# Patient Record
Sex: Female | Born: 1970 | Race: Black or African American | Hispanic: No | Marital: Single | Smoking: Never smoker
Health system: Southern US, Community
[De-identification: ages and names within clinical notes are randomized; demographics above are authoritative.]

## PROBLEM LIST (undated history)

## (undated) DIAGNOSIS — Z8489 Family history of other specified conditions: Secondary | ICD-10-CM

## (undated) DIAGNOSIS — I10 Essential (primary) hypertension: Secondary | ICD-10-CM

## (undated) DIAGNOSIS — Z9889 Other specified postprocedural states: Secondary | ICD-10-CM

## (undated) DIAGNOSIS — R112 Nausea with vomiting, unspecified: Secondary | ICD-10-CM

## (undated) DIAGNOSIS — R519 Headache, unspecified: Secondary | ICD-10-CM

## (undated) DIAGNOSIS — T4145XA Adverse effect of unspecified anesthetic, initial encounter: Secondary | ICD-10-CM

## (undated) DIAGNOSIS — C801 Malignant (primary) neoplasm, unspecified: Secondary | ICD-10-CM

## (undated) DIAGNOSIS — K760 Fatty (change of) liver, not elsewhere classified: Secondary | ICD-10-CM

## (undated) DIAGNOSIS — D649 Anemia, unspecified: Secondary | ICD-10-CM

## (undated) DIAGNOSIS — F419 Anxiety disorder, unspecified: Secondary | ICD-10-CM

## (undated) DIAGNOSIS — T8859XA Other complications of anesthesia, initial encounter: Secondary | ICD-10-CM

## (undated) DIAGNOSIS — Z1379 Encounter for other screening for genetic and chromosomal anomalies: Secondary | ICD-10-CM

## (undated) DIAGNOSIS — J189 Pneumonia, unspecified organism: Secondary | ICD-10-CM

## (undated) HISTORY — PX: ABDOMINAL HYSTERECTOMY: SHX81

## (undated) HISTORY — PX: BREAST SURGERY: SHX581

## (undated) HISTORY — PX: BACK SURGERY: SHX140

## (undated) HISTORY — PX: DILATION AND CURETTAGE OF UTERUS: SHX78

## (undated) HISTORY — PX: TONSILLECTOMY AND ADENOIDECTOMY: SUR1326

## (undated) HISTORY — DX: Encounter for other screening for genetic and chromosomal anomalies: Z13.79

## (undated) HISTORY — DX: Essential (primary) hypertension: I10

---
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
## 2006-08-08 DIAGNOSIS — C801 Malignant (primary) neoplasm, unspecified: Secondary | ICD-10-CM

## 2006-08-08 HISTORY — PX: MASTECTOMY: SHX3

## 2006-08-08 HISTORY — DX: Malignant (primary) neoplasm, unspecified: C80.1

## 2007-08-09 HISTORY — PX: PLACEMENT OF BREAST IMPLANTS: SHX6334

## 2016-03-15 ENCOUNTER — Encounter: Payer: Self-pay | Admitting: Emergency Medicine

## 2016-03-15 ENCOUNTER — Emergency Department
Admission: EM | Admit: 2016-03-15 | Discharge: 2016-03-16 | Disposition: A | Discharge status: Home / Self Care | Payer: Managed Care, Other (non HMO) | Attending: Emergency Medicine | Admitting: Emergency Medicine

## 2016-03-15 DIAGNOSIS — R112 Nausea with vomiting, unspecified: Secondary | ICD-10-CM | POA: Insufficient documentation

## 2016-03-15 DIAGNOSIS — Z853 Personal history of malignant neoplasm of breast: Secondary | ICD-10-CM | POA: Diagnosis not present

## 2016-03-15 HISTORY — DX: Malignant (primary) neoplasm, unspecified: C80.1

## 2016-03-15 LAB — COMPREHENSIVE METABOLIC PANEL
ALT: 8 U/L — AB (ref 14–54)
AST: 17 U/L (ref 15–41)
Albumin: 4 g/dL (ref 3.5–5.0)
Alkaline Phosphatase: 64 U/L (ref 38–126)
Anion gap: 8 (ref 5–15)
BUN: 8 mg/dL (ref 6–20)
CHLORIDE: 103 mmol/L (ref 101–111)
CO2: 26 mmol/L (ref 22–32)
CREATININE: 0.65 mg/dL (ref 0.44–1.00)
Calcium: 9.3 mg/dL (ref 8.9–10.3)
GFR calc non Af Amer: >60 mL/min (ref >60)
Glucose, Bld: 97 mg/dL (ref 65–99)
POTASSIUM: 3.3 mmol/L — AB (ref 3.5–5.1)
SODIUM: 137 mmol/L (ref 135–145)
Total Bilirubin: 0.6 mg/dL (ref 0.3–1.2)
Total Protein: 7.4 g/dL (ref 6.5–8.1)

## 2016-03-15 LAB — URINALYSIS COMPLETE WITH MICROSCOPIC (ARMC ONLY)
BILIRUBIN URINE: NEGATIVE
Glucose, UA: NEGATIVE mg/dL
Ketones, ur: NEGATIVE mg/dL
Leukocytes, UA: NEGATIVE
Nitrite: NEGATIVE
PH: 6 (ref 5.0–8.0)
PROTEIN: NEGATIVE mg/dL
Specific Gravity, Urine: 1.018 (ref 1.005–1.030)

## 2016-03-15 LAB — CBC
HEMATOCRIT: 38 % (ref 35.0–47.0)
Hemoglobin: 12.4 g/dL (ref 12.0–16.0)
MCH: 25.2 pg — AB (ref 26.0–34.0)
MCHC: 32.7 g/dL (ref 32.0–36.0)
MCV: 77 fL — AB (ref 80.0–100.0)
PLATELETS: 275 10*3/uL (ref 150–440)
RBC: 4.93 MIL/uL (ref 3.80–5.20)
RDW: 14.7 % — ABNORMAL HIGH (ref 11.5–14.5)
WBC: 14.1 10*3/uL — ABNORMAL HIGH (ref 3.6–11.0)

## 2016-03-15 LAB — POCT PREGNANCY, URINE: PREG TEST UR: NEGATIVE

## 2016-03-15 LAB — LIPASE, BLOOD: LIPASE: 21 U/L (ref 11–51)

## 2016-03-15 MED ORDER — ONDANSETRON 4 MG PO TBDP
4.0000 mg | ORAL_TABLET | Freq: Once | ORAL | Status: DC
  Filled 2016-03-15: qty 1

## 2016-03-15 NOTE — ED Provider Notes (Signed)
[facility] Emergency Department Provider Note  ____________________________________________   None    (approximate)  I have reviewed the triage vital signs and the nursing notes.   HISTORY  Chief Complaint Emesis and Nausea    HPI Robin Wilkins is a 45 y.o. female with a history of chronic low back pain who had a lumbar spinal injection by an orthopedic surgeon in [place] 1 week ago.  She presents for evaluation of multiple episodes of nausea and vomiting that has been occurring since she started taking methocarbamol as prescribed by her orthopedic surgeon.  She denies fever/chills, chest pain, shortness of breath, abdominal pain, dysuria.  She states that her back continues to hurt even worse than it did before the injection but that she is used to this pain.  She describes the little episodes of vomiting as severe and does not seem to be related to anything in particular other eating did seem to make it worse.  She went to an urgent care who then recommended she come to the emergency department.  She is currently feeling better and has not vomited recently.   Past Medical History:  Diagnosis Date  . Cancer ([place])    left breast    There are no active problems to display for this patient.   History reviewed. No pertinent surgical history.  Prior to Admission medications   Medication Sig Start Date End Date Taking? Authorizing Provider  ondansetron (ZOFRAN ODT) 4 MG disintegrating tablet Allow 1-2 tablets to dissolve in your mouth every 8 hours as needed for nausea/vomiting 03/16/16   Hinda Kehr, MD    Allergies Review of patient's allergies indicates no known allergies.  No family history on file.  Social History Social History  Substance Use Topics  . Smoking status: Never Smoker  . Smokeless tobacco: Never Used  . Alcohol use No    Review of Systems Constitutional: No fever/chills Eyes: No visual changes. ENT: No sore  throat. Cardiovascular: Denies chest pain. Respiratory: Denies shortness of breath. Gastrointestinal: No abdominal pain.  Multiple episodes of vomiting and persistent nausea.  No diarrhea.  No constipation. Genitourinary: Negative for dysuria. Musculoskeletal: Negative for back pain. Skin: Negative for rash. Neurological: Negative for headaches, focal weakness or numbness.  10-point ROS otherwise negative.  ____________________________________________   PHYSICAL EXAM:  VITAL SIGNS: ED Triage Vitals  Enc Vitals Group     BP 03/15/16 1923 135/75     Pulse Rate 03/15/16 1923 97     Resp 03/15/16 1923 18     Temp 03/15/16 1923 99 F (37.2 C)     Temp Source 03/15/16 1923 Oral     SpO2 03/15/16 1923 100 %     Weight 03/15/16 1923 236 lb (107 kg)     Height 03/15/16 1923 5\' 8"  (1.727 m)     Head Circumference --      Peak Flow --      Pain Score 03/15/16 1924 7     Pain Loc --      Pain Edu? --      Excl. in [place]? --     Constitutional: Alert and oriented. Well appearing and in no acute distress. Eyes: Conjunctivae are normal. PERRL. EOMI. Head: Atraumatic. Nose: No congestion/rhinnorhea. Mouth/Throat: Mucous membranes are moist.  Oropharynx non-erythematous. Neck: No stridor.  No meningeal signs.   Cardiovascular: Normal rate, regular rhythm. Good peripheral circulation. Grossly normal heart sounds.   Respiratory: Normal respiratory effort.  No retractions. Lungs CTAB. Gastrointestinal: Soft  and nontender. No distention.  Musculoskeletal: No lower extremity tenderness nor edema. No gross deformities of extremities. Mild tenderness to palpation all throughout the lumbar spine but with no evidence of cellulitis, induration, fluctuance, or other indication of infection or gross deformity. Neurologic:  Normal speech and language. No gross focal neurologic deficits are appreciated.  Skin:  Skin is warm, dry and intact. No rash noted. Psychiatric: Mood and affect are normal. Speech  and behavior are normal.  ____________________________________________   LABS (all labs ordered are listed, but only abnormal results are displayed)  Labs Reviewed  COMPREHENSIVE METABOLIC PANEL - Abnormal; Notable for the following:       Result Value   Potassium 3.3 (*)    ALT 8 (*)    All other components within normal limits  CBC - Abnormal; Notable for the following:    WBC 14.1 (*)    MCV 77.0 (*)    MCH 25.2 (*)    RDW 14.7 (*)    All other components within normal limits  URINALYSIS COMPLETEWITH MICROSCOPIC ([facility] ONLY) - Abnormal; Notable for the following:    Color, Urine YELLOW (*)    APPearance HAZY (*)    Hgb urine dipstick 2+ (*)    Bacteria, UA RARE (*)    Squamous Epithelial / LPF 6-30 (*)    All other components within normal limits  LIPASE, BLOOD  POC URINE PREG, ED  POCT PREGNANCY, URINE   ____________________________________________  EKG  None ____________________________________________  RADIOLOGY   No results found.  ____________________________________________   PROCEDURES  Procedure(s) performed:   Procedures   Critical Care performed: No ____________________________________________   INITIAL IMPRESSION / ASSESSMENT AND PLAN / ED COURSE  Pertinent labs & imaging results that were available during my care of the patient were reviewed by me and considered in my medical decision making (see chart for details).  Patient feels much better after Zofran and IV fluids.  She tolerated a by mouth challenge without difficulty.  I will prescribe her Zofran and recommend that she not take the methocarbamol anymore as she may be having a reaction to that medication.  She will follow up with her orthopedic surgeon for additional recommendations and management of her chronic back pain.  ____________________________________________  FINAL CLINICAL IMPRESSION(S) / ED DIAGNOSES  Final diagnoses:  Non-intractable vomiting with nausea, vomiting  of unspecified type     MEDICATIONS GIVEN DURING THIS VISIT:  Medications  ondansetron (ZOFRAN-ODT) disintegrating tablet 4 mg (not administered)     NEW OUTPATIENT MEDICATIONS STARTED DURING THIS VISIT:  New Prescriptions   ONDANSETRON (ZOFRAN ODT) 4 MG DISINTEGRATING TABLET    Allow 1-2 tablets to dissolve in your mouth every 8 hours as needed for nausea/vomiting      Note:  This document was prepared using Dragon voice recognition software and may include unintentional dictation errors.    Hinda Kehr, MD 03/16/16 [phone]

## 2016-03-15 NOTE — ED Notes (Signed)
Pt. Reports an epidural tx to back 3 weeks ago to address a ruptured disc. Returned to work this week on "light duty", but reports her job did not follow orders and pt. Was crying in pain  At work 3 times yesterday. Pt. Reports being nauseated and vomiting everything she takes in PO since. Pt. Said unsure if something wrong abdominally or if nausea and vomiting is due to the pain.

## 2016-03-15 NOTE — ED Triage Notes (Signed)
Has a back injury from work, back was injected last week.  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  Vomited last night.  Continues to have nausea and vomiting today.

## 2016-03-16 MED ORDER — ONDANSETRON 4 MG PO TBDP: ORAL_TABLET | ORAL | 0 refills | Status: AC

## 2016-03-16 NOTE — ED Notes (Signed)
MD updated on pt. Successful PO challenge and is preparing to send patient home.

## 2016-03-16 NOTE — Discharge Instructions (Signed)
As we discussed, your workup today was reassuring.  Though we do not know exactly what is causing your symptoms, it appears that you have no emergent medical condition at this time are safe to go home and follow up as recommended in this paperwork.  As you are most likely having a reaction to the new medication you are taking (methocarbamol), we recommend you avoid this one if possible, at least until you can follow-up with your orthopedic doctor.  Please return immediately to the Emergency Department if you develop any new or worsening symptoms that concern you.

## 2016-03-16 NOTE — ED Notes (Signed)
Pt. Verbalizes understanding of d/c instructions, prescriptions, and follow-up. VS stable. Pt. In NAD at time of d/c and denies further concerns regarding this visit. Pt.wheeled Out of the unit by RN

## 2017-04-04 ENCOUNTER — Inpatient Hospital Stay: Payer: Managed Care, Other (non HMO) | Attending: Oncology | Admitting: Oncology

## 2017-04-04 ENCOUNTER — Inpatient Hospital Stay: Payer: Managed Care, Other (non HMO)

## 2017-04-04 ENCOUNTER — Encounter: Payer: Self-pay | Admitting: Oncology

## 2017-04-04 VITALS — BP 137/80 | HR 80 | Temp 98.0°F | Resp 18 | Ht 67.5 in | Wt 251.6 lb

## 2017-04-04 DIAGNOSIS — Z170 Estrogen receptor positive status [ER+]: Secondary | ICD-10-CM | POA: Insufficient documentation

## 2017-04-04 DIAGNOSIS — Z9221 Personal history of antineoplastic chemotherapy: Secondary | ICD-10-CM | POA: Insufficient documentation

## 2017-04-04 DIAGNOSIS — R222 Localized swelling, mass and lump, trunk: Secondary | ICD-10-CM | POA: Insufficient documentation

## 2017-04-04 DIAGNOSIS — Z79899 Other long term (current) drug therapy: Secondary | ICD-10-CM | POA: Insufficient documentation

## 2017-04-04 DIAGNOSIS — Z853 Personal history of malignant neoplasm of breast: Secondary | ICD-10-CM

## 2017-04-04 DIAGNOSIS — Z923 Personal history of irradiation: Secondary | ICD-10-CM | POA: Diagnosis not present

## 2017-04-04 DIAGNOSIS — Z79810 Long term (current) use of selective estrogen receptor modulators (SERMs): Secondary | ICD-10-CM | POA: Insufficient documentation

## 2017-04-04 DIAGNOSIS — Z803 Family history of malignant neoplasm of breast: Secondary | ICD-10-CM | POA: Insufficient documentation

## 2017-04-04 DIAGNOSIS — Z9013 Acquired absence of bilateral breasts and nipples: Secondary | ICD-10-CM

## 2017-04-04 DIAGNOSIS — C50911 Malignant neoplasm of unspecified site of right female breast: Secondary | ICD-10-CM | POA: Insufficient documentation

## 2017-04-04 DIAGNOSIS — I10 Essential (primary) hypertension: Secondary | ICD-10-CM | POA: Diagnosis not present

## 2017-04-04 DIAGNOSIS — E559 Vitamin D deficiency, unspecified: Secondary | ICD-10-CM | POA: Diagnosis not present

## 2017-04-04 DIAGNOSIS — Z90710 Acquired absence of both cervix and uterus: Secondary | ICD-10-CM | POA: Insufficient documentation

## 2017-04-04 LAB — COMPREHENSIVE METABOLIC PANEL
ALK PHOS: 67 U/L (ref 38–126)
ALT: 19 U/L (ref 14–54)
AST: 23 U/L (ref 15–41)
Albumin: 3.9 g/dL (ref 3.5–5.0)
Anion gap: 7 (ref 5–15)
BUN: 8 mg/dL (ref 6–20)
CALCIUM: 9.1 mg/dL (ref 8.9–10.3)
CO2: 29 mmol/L (ref 22–32)
CREATININE: 0.69 mg/dL (ref 0.44–1.00)
Chloride: 102 mmol/L (ref 101–111)
GFR calc non Af Amer: >60 mL/min (ref >60)
GLUCOSE: 90 mg/dL (ref 65–99)
Potassium: 3.4 mmol/L — ABNORMAL LOW (ref 3.5–5.1)
SODIUM: 138 mmol/L (ref 135–145)
TOTAL PROTEIN: 7 g/dL (ref 6.5–8.1)
Total Bilirubin: 0.5 mg/dL (ref 0.3–1.2)

## 2017-04-04 LAB — CBC WITH DIFFERENTIAL/PLATELET
BASOS PCT: 1 %
Basophils Absolute: 0 10*3/uL (ref 0–0.1)
EOS ABS: 0 10*3/uL (ref 0–0.7)
Eosinophils Relative: 1 %
HCT: 35.5 % (ref 35.0–47.0)
Hemoglobin: 11.8 g/dL — ABNORMAL LOW (ref 12.0–16.0)
Lymphocytes Relative: 32 %
Lymphs Abs: 2.7 10*3/uL (ref 1.0–3.6)
MCH: 26.3 pg (ref 26.0–34.0)
MCHC: 33.3 g/dL (ref 32.0–36.0)
MCV: 79 fL — ABNORMAL LOW (ref 80.0–100.0)
MONO ABS: 0.7 10*3/uL (ref 0.2–0.9)
Monocytes Relative: 8 %
NEUTROS ABS: 5 10*3/uL (ref 1.4–6.5)
Neutrophils Relative %: 58 %
Platelets: 277 10*3/uL (ref 150–440)
RBC: 4.49 MIL/uL (ref 3.80–5.20)
RDW: 13.4 % (ref 11.5–14.5)
WBC: 8.4 10*3/uL (ref 3.6–11.0)

## 2017-04-04 LAB — IRON AND TIBC
IRON: 97 ug/dL (ref 28–170)
Saturation Ratios: 33 % — ABNORMAL HIGH (ref 10.4–31.8)
TIBC: 293 ug/dL (ref 250–450)
UIBC: 196 ug/dL

## 2017-04-04 LAB — FERRITIN: Ferritin: 215 ng/mL (ref 11–307)

## 2017-04-04 MED ORDER — TAMOXIFEN CITRATE 10 MG PO TABS: 20.0000 mg | ORAL_TABLET | Freq: Every day | ORAL | 3 refills | Status: DC

## 2017-04-04 NOTE — Progress Notes (Signed)
Patient here today as a new patient.  Patient

## 2017-04-04 NOTE — Progress Notes (Addendum)
Hematology/Oncology Consult note [facility] [phone] [phone]  CONSULT NOTE Patient Care Team: Robin Crouch, MD as PCP - General (Internal Medicine)  CHIEF COMPLAINTS/PURPOSE OF CONSULTATION:  I have a history of breast cancer   HISTORY OF PRESENTING ILLNESS:  1 Robin Wilkins 46 y.o.  female with past medical history listed as below, most significant for a history of CANCER who was referred to Dr. Doy Wilkins to me for evaluation and management of patient's breast cancer treatment. Patient reports that she moved from [place]. In 2008 may she was diagnosed with left breast cancer. She remembered that she had an excisional biopsy of the breast lesion at first. She was told that it was hormone receptor positive and HER-2 positive. She underwent neoadjuvant chemotherapy July to October 2008. She had bilateral mastectomy on 05/09/2007. She also underwent radiation adjuvantly. She completed 1 year of Herceptin. She was started on tamoxifen in 2009 after she completed all her chemotherapy and radiation. Patient reports that the initial diagnosis, chemotherapy, and surgery were done at Robin Wilkins. Later she transferred her care to Robin Wilkins and initially she followed up with Dr. Brigitte Wilkins once Dr. Manuella Wilkins retired she follows up with Dr. Corrie Wilkins. She has done genetic test, which was negative per patient.  Patient is single, she has 2 adult children, one boy and one girl. She has a history of hysterectomy.  2  04/06/2017 We've received incomplete medical records from Robin Wilkins. In 2008, he initially presented with bilateral axillary lymph node adenopathy which lead to further workup. We do not have her mammogram and CT report. 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 ER 11% PR 1%, HER-2 overexpression.  Patient underwent PET scan on 02/01/2007 which showed solid hypermetabolic soft tissue mass in the left axilla consistent with metastatic adenopathy measuring 4.9 cm x 2.6 cm no other hypermetabolic foci demonstrated. The patient was started on neoadjuvant chemotherapy with AC/ T regimen. We do not have details about her chemotherapy regimen. Per patient she also completed 1 year of Herceptin. Per Dr. Patrice Wilkins, Robin Wilkins's note, the patient had great response to the new adjuvant chemotherapy. It was noted that her MRI which was done in 03/14/2007 showed no residual breast or axillary masses. We do not have the full MRI report.  Patient underwent bilateral mastectomy on 06/05/2007. Right mastectomy with attached axillary tail showing focal increased of fibrous stroma, cystic dilatation of the ducts with columnar cell changes, foci of microcalcifications, mild to moderate ductal hyperplasia and the scattered foci of atypical lobular hyperplastic. No in situ carcinoma or invasive carcinoma identified. Skin and nipple of the right breast showing no diagnostic abnormality. Left mastectomy with axillary tail showing organizing fat necrosis, and fibrosis around the previous surgical biopsy site 8 to 9:00 position, retroareolar, with foci of granulomatous changes with presence of many multinucleated giant cells. No residual carcinoma identified. Breast tissue away from the biopsy site showing focal stromal fibrosis, cystic dilatation of ducts with columnar cell changes, mild to moderate ductal hyperplastic, foci of sclerosing adenosis and atypical lobular hyperplastic. 3 level I and the level II left axillary lymph nodes showing metastatic and a moderately differentiated ductal carcinoma involving 4 of the 8 lymph nodes, Matted, group of lymph nodes measures 3.5 cm in greatest dimension. Focal fibrosis and the calcification  noted probably related  to the preoperative chemotherapy. Final TNM coating T2, N2 A, NX.  Patient was started on tamoxifen 20 mg in 2009. Patient also had bilateral breast reconstruction. In Dr. Manson Wilkins note, she had an episode of clotting which threatened to be destroying the entire left breast reconstruction surgery she was treated with Lovenox, and then have evidence of worsening thrombosis and the vascularization, and he walks she was put on aspirin as well and then finally tamoxifen has been temporarily discontinued, which was later restarted. .   ROS:   Review of Systems  Constitutional: Negative.   HENT:  Negative.   Eyes: Negative.   Respiratory: Negative.   Cardiovascular: Negative.   Gastrointestinal: Negative.   Endocrine: Negative.   Genitourinary: Negative.    Musculoskeletal: Negative.   Skin: Negative.   Neurological: Negative.   Hematological: Negative.   Psychiatric/Behavioral: Negative.     MEDICAL HISTORY:  Past Medical History:  Diagnosis Date  . Cancer [facility])    left breast  . Hypertension     SURGICAL HISTORY: Past Surgical History:  Procedure Laterality Date  . ABDOMINAL HYSTERECTOMY    . BACK SURGERY    . MASTECTOMY Bilateral   . TONSILLECTOMY AND ADENOIDECTOMY      SOCIAL HISTORY: Social History   Social History  . Marital status: Single    Spouse name: N/A  . Number of children: N/A  . Years of education: N/A   Occupational History  . Not on file.   Social History Main Topics  . Smoking status: Never Smoker  . Smokeless tobacco: Never Used  . Alcohol use No  . Drug use: No  . Sexual activity: Not Currently   Other Topics Concern  . Not on file   Social History Narrative  . No narrative on file    FAMILY HISTORY: Family History  Problem Relation Age of Onset  . Kidney cancer Mother   . Diabetes Mother   . Hypertension Mother   . Breast cancer Paternal Aunt   . Breast cancer Paternal Grandmother     ALLERGIES:  has No Known  Allergies.  MEDICATIONS:  Current Outpatient Prescriptions  Medication Sig Dispense Refill  . cyclobenzaprine (FLEXERIL) 10 MG tablet Take 10 mg by mouth as needed.    Marland Kitchen losartan-hydrochlorothiazide (HYZAAR) 100-12.5 MG tablet Take by mouth.    . naproxen (EC NAPROSYN) 500 MG EC tablet Take 500 mg by mouth 2 (two) times daily.    . ondansetron (ZOFRAN ODT) 4 MG disintegrating tablet Allow 1-2 tablets to dissolve in your mouth every 8 hours as needed for nausea/vomiting 30 tablet 0  . tamoxifen (NOLVADEX) 10 MG tablet Take 10 mg by mouth 2 (two) times daily.    Marland Kitchen triamcinolone (KENALOG) 0.025 % cream APPLY TO AFFECTED AREA TWICE A DAY    . venlafaxine XR (EFFEXOR-XR) 150 MG 24 hr capsule Take 150 mg by mouth daily.     No current facility-administered medications for this visit.       Marland Kitchen  PHYSICAL EXAMINATION: ECOG PERFORMANCE STATUS: 0 - Asymptomatic Vitals:   04/04/17 0930  BP: 137/80  Wilkins: 80  Resp: 18  Temp: 98 F (36.7 C)   Filed Weights   04/04/17 0930  Weight: 251 lb 9 oz (114.1 kg)    GENERAL: Well-nourished well-developed; Alert, no distress and comfortable.  EYES: no pallor or icterus OROPHARYNX: no thrush or ulceration; good dentition  NECK: supple, no masses felt LYMPH:  no palpable lymphadenopathy in the  cervical, axillary or inguinal regions LUNGS: clear to auscultation and  No wheeze or crackles HEART/CVS: regular rate & rhythm and no murmurs; No lower extremity edema ABDOMEN: abdomen soft, non-tender and normal bowel sounds Musculoskeletal:no cyanosis of digits and no clubbing  PSYCH: alert & oriented x 3  NEURO: no focal motor/sensory deficits SKIN:  no rashes or significant lesions Breast exam is performed in seated and lying down position. Patient is status post bilateral mastectomy with reconstruction. The implant edges are intact. Small mobile nodule palpated 2'o clock.   No evidence of bilateral axillary adenopathy.   LABORATORY DATA:  I have  reviewed the data as listed Lab Results  Component Value Date   WBC 8.4 04/04/2017   HGB 11.8 (L) 04/04/2017   HCT 35.5 04/04/2017   MCV 79.0 (L) 04/04/2017   PLT 277 04/04/2017    Recent Labs  04/04/17 1021  NA 138  K 3.4*  CL 102  CO2 29  GLUCOSE 90  BUN 8  CREATININE 0.69  CALCIUM 9.1  GFRNONAA >60  GFRAA >60  PROT 7.0  ALBUMIN 3.9  AST 23  ALT 19  ALKPHOS 67  BILITOT 0.5   Vitamin D 17.4  RADIOGRAPHIC STUDIES: I have personally reviewed the radiological images as listed and agreed with the findings in the report. No results found.  ASSESSMENT & PLAN:  1. History of breast cancer   2. Vitamin D deficiency   3. Long-term current use of tamoxifen   4. Subcutaneous nodule of chest wall    # Discussed with patient that we will need to obtain medical records from both [place] and [facility] in [place]. Once we have the records out see her again and finalize management plan. For now continue tamoxifen. In general recommends tamoxifen for 10 years. # Check CBC and CMP today. She has mild microcytosis. We'll check iron TIBC and ferritin.  #Her vitamin D level was checked with Dr. Doy Wilkins. Discussed with patient that an adequate vitamin D level is associated with lower breast cancer recurrence and better outcome. She is already taking oral vitamin D supplementation.  #Chest wall nodule, unknown etiology. Per patient has been there chronically. We will review medical records if we obtain any from the previous cancer Center. Will refer to surgeon for further workup at next follow-up.  All questions were answered. The patient knows to call the clinic with any problems questions or concerns.  Return of visit:  Thank you for this kind referral and the opportunity to participate in the care of this patient. A copy of today's note is routed to referring provider Dr.Sparks.    [name], MD, PhD Hematology Oncology [facility] Pager- [phone] 04/04/2017

## 2017-04-05 ENCOUNTER — Other Ambulatory Visit: Payer: Self-pay | Admitting: Internal Medicine

## 2017-04-05 DIAGNOSIS — R1011 Right upper quadrant pain: Secondary | ICD-10-CM

## 2017-04-12 ENCOUNTER — Ambulatory Visit
Admission: RE | Admit: 2017-04-12 | Discharge: 2017-04-12 | Disposition: A | Discharge status: Home / Self Care | Payer: Managed Care, Other (non HMO) | Source: Ambulatory Visit | Attending: Internal Medicine | Admitting: Internal Medicine

## 2017-04-12 DIAGNOSIS — K760 Fatty (change of) liver, not elsewhere classified: Secondary | ICD-10-CM | POA: Diagnosis not present

## 2017-04-12 DIAGNOSIS — R1011 Right upper quadrant pain: Secondary | ICD-10-CM | POA: Insufficient documentation

## 2017-04-18 ENCOUNTER — Inpatient Hospital Stay: Payer: Managed Care, Other (non HMO) | Attending: Oncology | Admitting: Oncology

## 2017-04-18 ENCOUNTER — Encounter: Payer: Self-pay | Admitting: Oncology

## 2017-04-18 ENCOUNTER — Encounter: Payer: Self-pay | Admitting: *Deleted

## 2017-04-18 VITALS — BP 122/79 | HR 93 | Temp 99.2°F | Wt 249.5 lb

## 2017-04-18 DIAGNOSIS — I10 Essential (primary) hypertension: Secondary | ICD-10-CM | POA: Insufficient documentation

## 2017-04-18 DIAGNOSIS — Z923 Personal history of irradiation: Secondary | ICD-10-CM | POA: Diagnosis not present

## 2017-04-18 DIAGNOSIS — Z8051 Family history of malignant neoplasm of kidney: Secondary | ICD-10-CM | POA: Diagnosis not present

## 2017-04-18 DIAGNOSIS — Z79810 Long term (current) use of selective estrogen receptor modulators (SERMs): Secondary | ICD-10-CM

## 2017-04-18 DIAGNOSIS — D649 Anemia, unspecified: Secondary | ICD-10-CM | POA: Diagnosis not present

## 2017-04-18 DIAGNOSIS — Z803 Family history of malignant neoplasm of breast: Secondary | ICD-10-CM | POA: Insufficient documentation

## 2017-04-18 DIAGNOSIS — R222 Localized swelling, mass and lump, trunk: Secondary | ICD-10-CM

## 2017-04-18 DIAGNOSIS — Z9013 Acquired absence of bilateral breasts and nipples: Secondary | ICD-10-CM

## 2017-04-18 DIAGNOSIS — Z853 Personal history of malignant neoplasm of breast: Secondary | ICD-10-CM | POA: Diagnosis present

## 2017-04-18 DIAGNOSIS — R918 Other nonspecific abnormal finding of lung field: Secondary | ICD-10-CM | POA: Insufficient documentation

## 2017-04-18 DIAGNOSIS — Z9221 Personal history of antineoplastic chemotherapy: Secondary | ICD-10-CM | POA: Diagnosis not present

## 2017-04-18 DIAGNOSIS — Z90710 Acquired absence of both cervix and uterus: Secondary | ICD-10-CM

## 2017-04-18 DIAGNOSIS — Z79899 Other long term (current) drug therapy: Secondary | ICD-10-CM | POA: Insufficient documentation

## 2017-04-18 MED ORDER — VITAMIN D 1000 UNITS PO TABS: 1000.0000 [IU] | ORAL_TABLET | Freq: Every day | ORAL | 3 refills | Status: DC

## 2017-04-18 NOTE — Progress Notes (Signed)
Hematology/Oncology Follow Up Note [facility] Telephone:([phone] [phone]  CONSULT NOTE Patient Care Team: [name], MD as PCP - General (Internal Medicine)  CHIEF COMPLAINTS/PURPOSE OF CONSULTATION:  I have a history of breast cancer   HISTORY OF PRESENTING ILLNESS:  1 Robin Wilkins 46 y.o.  female with past medical history listed as below, most significant for a history of CANCER who was referred to Dr. [name] to me for evaluation and management of patient's breast cancer treatment. Patient reports that she moved from [place]. In 2008 may she was diagnosed with left breast cancer. She remembered that she had an excisional biopsy of the breast lesion at first. She was told that it was hormone receptor positive and HER-2 positive. She underwent neoadjuvant chemotherapy July to October 2008. She had bilateral mastectomy on 05/09/2007. She also underwent radiation adjuvantly. She completed 1 year of Herceptin. She was started on tamoxifen in 2009 after she completed all her chemotherapy and radiation. Patient reports that the initial diagnosis, chemotherapy, and surgery were done at [place]. Later she transferred her care to [facility] and initially she followed up with Dr. [name] once Dr. [name] retired she follows up with Dr. [name]. She has done genetic test, which was negative per patient.  Patient is single, she has 2 adult children, one boy and one girl. She has a history of hysterectomy.  2  04/06/2017 We've received incomplete medical records from [place]. In 2008, he initially presented with bilateral axillary lymph node adenopathy which lead to further workup. We do not have her mammogram and CT report. 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 ER 11% PR 1%, HER-2 overexpression.  Patient underwent PET scan on 02/01/2007 which showed solid hypermetabolic soft tissue mass in the left axilla consistent with metastatic adenopathy measuring 4.9 cm x 2.6 cm no other hypermetabolic foci demonstrated. The patient was started on neoadjuvant chemotherapy with AC/ T regimen. We do not have details about her chemotherapy regimen. Per patient she also completed 1 year of Herceptin. Per Dr. [name], [name]'s note, the patient had great response to the new adjuvant chemotherapy. It was noted that her MRI which was done in 03/14/2007 showed no residual breast or axillary masses. We do not have the full MRI report.  Patient underwent bilateral mastectomy on 06/05/2007.  1 Right mastectomy with attached axillary tail showing focal increased of fibrous stroma, cystic dilatation of the ducts with columnar cell changes, foci of microcalcifications, mild to moderate ductal hyperplasia and the scattered foci of atypical lobular hyperplastic. No in situ carcinoma or invasive carcinoma identified. Skin and nipple of the right breast showing no diagnostic abnormality.  2 Left mastectomy with axillary tail showing organizing fat necrosis, and fibrosis around the previous surgical biopsy site 8 to 9:00 position, retroareolar, with foci of granulomatous changes with presence of many multinucleated giant cells. No residual carcinoma identified. Breast tissue away from the biopsy site showing focal stromal fibrosis, cystic dilatation of ducts with columnar cell changes, mild to moderate ductal hyperplastic, foci of sclerosing adenosis and atypical lobular hyperplastic.   3 level I and the level II left axillary lymph nodes showing metastatic and a moderately differentiated ductal carcinoma involving 4 of the 8 lymph nodes, Matted, group of lymph nodes measures 3.5 cm in  greatest dimension. Focal fibrosis and the calcification noted  probably related to the preoperative chemotherapy. Final TNM coating T2, N2a, NX.  (the original tumor was excisional biopsied).   Patient was started on tamoxifen 20 mg in 2009. Patient also had bilateral breast reconstruction. In Dr. [name] note, she had an episode of clotting which threatened to be destroying the entire left breast reconstruction surgery she was treated with Lovenox, and then have evidence of worsening thrombosis and the vascularization, and he walks she was put on aspirin as well and then finally tamoxifen has been temporarily discontinued, which was later restarted. .  INTERVAL HISTORY Patient presents to further discuss about the management plan after records are obtained.    ROS:   Review of Systems  Constitutional: Negative.   HENT:  Negative.   Eyes: Negative.   Respiratory: Negative.   Cardiovascular: Negative.   Gastrointestinal: Negative.   Endocrine: Negative.   Genitourinary: Negative.    Musculoskeletal: Negative.   Skin: Negative.   Neurological: Negative.   Hematological: Negative.   Psychiatric/Behavioral: Negative.     MEDICAL HISTORY:  Past Medical History:  Diagnosis Date  . Cancer [facility])    left breast  . Hypertension     SURGICAL HISTORY: Past Surgical History:  Procedure Laterality Date  . ABDOMINAL HYSTERECTOMY    . BACK SURGERY    . MASTECTOMY Bilateral   . TONSILLECTOMY AND ADENOIDECTOMY      SOCIAL HISTORY: Social History   Social History  . Marital status: Single    Spouse name: N/A  . Number of children: N/A  . Years of education: N/A   Occupational History  . Not on file.   Social History Main Topics  . Smoking status: Never Smoker  . Smokeless tobacco: Never Used  . Alcohol use No  . Drug use: No  . Sexual activity: Not Currently   Other Topics Concern  . Not on file   Social History Narrative  . No narrative on file    FAMILY HISTORY: Family History  Problem Relation Age of Onset  . Kidney cancer  Mother   . Diabetes Mother   . Hypertension Mother   . Breast cancer Paternal Aunt   . Breast cancer Paternal Grandmother     ALLERGIES:  has No Known Allergies.  MEDICATIONS:  Current Outpatient Prescriptions  Medication Sig Dispense Refill  . cyclobenzaprine (FLEXERIL) 10 MG tablet Take 10 mg by mouth as needed.    Marland Kitchen losartan-hydrochlorothiazide (HYZAAR) 100-12.5 MG tablet Take by mouth.    . naproxen (EC NAPROSYN) 500 MG EC tablet Take 500 mg by mouth 2 (two) times daily.    . ondansetron (ZOFRAN ODT) 4 MG disintegrating tablet Allow 1-2 tablets to dissolve in your mouth every 8 hours as needed for nausea/vomiting 30 tablet 0  . tamoxifen (NOLVADEX) 10 MG tablet Take 2 tablets (20 mg total) by mouth daily. 60 tablet 3  . triamcinolone (KENALOG) 0.025 % cream APPLY TO AFFECTED AREA TWICE A DAY    . venlafaxine XR (EFFEXOR-XR) 150 MG 24 hr capsule Take 150 mg by mouth daily.     No current facility-administered medications for this visit.       Marland Kitchen  PHYSICAL EXAMINATION: ECOG PERFORMANCE STATUS: 0 - Asymptomatic There were no vitals filed for this visit. There were no vitals filed for this visit.  GENERAL: Well-nourished well-developed; Alert, no distress and comfortable.  EYES: no pallor or icterus OROPHARYNX: no thrush or ulceration; good dentition  NECK: supple,  no masses felt LYMPH:  no palpable lymphadenopathy in the cervical, axillary or inguinal regions LUNGS: clear to auscultation and  No wheeze or crackles HEART/CVS: regular rate & rhythm and no murmurs; No lower extremity edema ABDOMEN: abdomen soft, non-tender and normal bowel sounds Musculoskeletal:no cyanosis of digits and no clubbing  PSYCH: alert & oriented x 3  NEURO: no focal motor/sensory deficits SKIN:  no rashes or significant lesions Breast exam is performed in seated and lying down position. Patient is status post bilateral mastectomy with reconstruction. The implant edges are intact. Small mobile  nodule palpated 2'o clock.   No evidence of bilateral axillary adenopathy.   LABORATORY DATA:  I have reviewed the data as listed Lab Results  Component Value Date   WBC 8.4 04/04/2017   HGB 11.8 (L) 04/04/2017   HCT 35.5 04/04/2017   MCV 79.0 (L) 04/04/2017   PLT 277 04/04/2017    Recent Labs  04/04/17 1021  NA 138  K 3.4*  CL 102  CO2 29  GLUCOSE 90  BUN 8  CREATININE 0.69  CALCIUM 9.1  GFRNONAA >60  GFRAA >60  PROT 7.0  ALBUMIN 3.9  AST 23  ALT 19  ALKPHOS 67  BILITOT 0.5   Vitamin D 17.4  RADIOGRAPHIC STUDIES: I have personally reviewed the radiological images as listed and agreed with the findings in the report. US Abdomen Complete  Result Date: 04/12/2017 CLINICAL DATA:  Right upper quadrant pain for 3 months EXAM: ABDOMEN ULTRASOUND COMPLETE COMPARISON:  None. FINDINGS: Gallbladder: No gallstones or wall thickening visualized. No sonographic Murphy sign noted by sonographer. Common bile duct: Diameter: Normal caliber, 3 mm Liver: Increased echotexture compatible with fatty infiltration. No focal abnormality or biliary ductal dilatation. Area of focal fatty sparing adjacent to the gallbladder fossa. Portal vein is patent on color Doppler imaging with normal direction of blood flow towards the liver. IVC: No abnormality visualized. Pancreas: Visualized portion unremarkable. Spleen: Size and appearance within normal limits. Right Kidney: Length: 12.2 cm. Echogenicity within normal limits. No mass or hydronephrosis visualized. Left Kidney: Length: 11.9 cm. Echogenicity within normal limits. No mass or hydronephrosis visualized. Abdominal aorta: No aneurysm visualized. Other findings: None. IMPRESSION: Fatty infiltration of the liver.  No acute findings. Electronically Signed   By: Rolm Baptise M.D.   On: 04/12/2017 09:33    ASSESSMENT & PLAN:  1. History of breast cancer   2. Long-term current use of tamoxifen   3. Subcutaneous nodule of chest wall    # Discussed  with patient that we will need to obtain medical records from both [place] and [facility] in [place]. Once we have the records out see her again and finalize management plan.Recommend continuing tamoxifen to complete 10 years. Her menopause state is hard to determine as she had hysterectomy. She has had hormone levels tested with her previous oncologist and was non conclusive. Will refer her to GYN to determine her menopause state. If she is truly postmenopause, can switch to Aromatase inhibitor.   # Microcytosis. Iron panel reviewed. No iron deficiency.  Microcytosis is very mild.   #Her vitamin D level was checked and was mildly low at 17.4. She asked for a Vitamin D prescription. Rx of Vitamin D 1000 unit daily sent to her pharmacy.   #Chest wall nodule, unknown etiology. Per patient has been there chronically. The records we obtained did not mention any work up for this nodule.  Will refer to Dr.Byrnett for evaluation.  All questions were answered. The patient knows to call the clinic with any problems questions or concerns.  Return of visit: 3 months w labs. Thank you for this kind referral and the opportunity to participate in the care of this patient. A copy of today's note is routed to referring provider Dr.Sparks.    [name], MD, PhD Hematology Oncology [facility] at [facility] Pager- [phone] 04/18/2017

## 2017-04-18 NOTE — Progress Notes (Signed)
Patient here today for follow up.   

## 2017-05-02 ENCOUNTER — Encounter: Payer: Self-pay | Admitting: General Surgery

## 2017-05-02 ENCOUNTER — Ambulatory Visit (INDEPENDENT_AMBULATORY_CARE_PROVIDER_SITE_OTHER): Payer: Managed Care, Other (non HMO) | Admitting: General Surgery

## 2017-05-02 ENCOUNTER — Inpatient Hospital Stay: Payer: Self-pay

## 2017-05-02 VITALS — BP 122/78 | HR 116 | Resp 16 | Ht 68.0 in | Wt 248.0 lb

## 2017-05-02 DIAGNOSIS — R222 Localized swelling, mass and lump, trunk: Secondary | ICD-10-CM | POA: Diagnosis not present

## 2017-05-02 DIAGNOSIS — D229 Melanocytic nevi, unspecified: Secondary | ICD-10-CM

## 2017-05-02 NOTE — Progress Notes (Signed)
Patient ID: Robin Wilkins, female   DOB: 10-16-1970, 46 y.o.   MRN: [id number]  Chief Complaint  Patient presents with  . Other    HPI Robin Wilkins is a 46 y.o. female.  Here today for evaluation of right chest wall nodule referred by Dr Tasia Catchings. She had left breast cancer in 2008 while Living in [place], [place]. She states she could feel something different in the right chest wall near implant for 1-2 years and was told scar tissue. She states it seems to be larger. She states it is more noticeable lying down. She does have right breast implant and left breast flap. She admits to intentionally losing 14 pounds since July. Initial BRCA negative. She also wants you to look at a mole on her left buttock. She states it tender and "has got to go". She works at the [employer].  HPI  Past Medical History:  Diagnosis Date  . Cancer [facility]) 2008   left breast, [facility]  . Genetic screening    negative per pt  . Hypertension     Past Surgical History:  Procedure Laterality Date  . ABDOMINAL HYSTERECTOMY     partial  . BACK SURGERY    . MASTECTOMY Bilateral 2008   chemo and rad  . PLACEMENT OF BREAST IMPLANTS Bilateral 2009   spacers  . PLACEMENT OF BREAST IMPLANTS Right 2009   left breast flap 2010  . TONSILLECTOMY AND ADENOIDECTOMY      Family History  Problem Relation Age of Onset  . Kidney cancer Mother   . Diabetes Mother   . Hypertension Mother   . Breast cancer Paternal Aunt        late 57's  . Breast cancer Paternal Grandmother        60's    Social History Social History  Substance Use Topics  . Smoking status: Never Smoker  . Smokeless tobacco: Never Used  . Alcohol use No    No Known Allergies  Current Outpatient Prescriptions  Medication Sig Dispense Refill  . cholecalciferol (VITAMIN D) 1000 units tablet Take 1 tablet (1,000 Units total) by mouth daily. 30 tablet 3  . cyclobenzaprine (FLEXERIL) 10 MG tablet Take 10 mg by  mouth as needed.    Marland Kitchen losartan-hydrochlorothiazide (HYZAAR) 100-12.5 MG tablet Take by mouth.    . naproxen (EC NAPROSYN) 500 MG EC tablet Take 500 mg by mouth as needed.     . ondansetron (ZOFRAN ODT) 4 MG disintegrating tablet Allow 1-2 tablets to dissolve in your mouth every 8 hours as needed for nausea/vomiting 30 tablet 0  . tamoxifen (NOLVADEX) 10 MG tablet Take 2 tablets (20 mg total) by mouth daily. 60 tablet 3  . triamcinolone (KENALOG) 0.025 % cream APPLY TO AFFECTED AREA TWICE A DAY PRN    . venlafaxine XR (EFFEXOR-XR) 150 MG 24 hr capsule Take 150 mg by mouth daily.     No current facility-administered medications for this visit.     Review of Systems Review of Systems  Constitutional: Negative.   Respiratory: Negative.   Cardiovascular: Negative.     Blood pressure 122/78, pulse (!) 116, resp. rate 16, height '5\' 8"'  (1.727 m), weight 248 lb (112.5 kg).  Physical Exam Physical Exam  Constitutional: She is oriented to person, place, and time. She appears well-developed and well-nourished.  HENT:  Mouth/Throat: Oropharynx is clear and moist.  Eyes: Conjunctivae are normal. No scleral icterus.  Neck: Neck supple.  Cardiovascular: Normal rate, regular  rhythm and normal heart sounds.   Pulmonary/Chest: Effort normal and breath sounds normal.    1.5 x 2.5 cm nodule right chest wall mass  Lymphadenopathy:    She has no cervical adenopathy.    She has no axillary adenopathy.  Neurological: She is alert and oriented to person, place, and time.  Skin: Skin is warm and dry.  5 mm mole right gluteal check  Psychiatric: Her behavior is normal.    Data Reviewed Ultrasound examination of the breast mass was undertaken. There is scarring below the mastectomy incision, and just cephalad to this is a hypoechoic mass measuring up to 1.1 cm in diameter. This is indeterminant. No increased vascular flow noted. BI-RADS-3.  Assessment    Suspected lipoma of the right anterior chest  wall.  Symptomatic mole right gluteal cheek.    Plan         Recommend excisional biopsy right chest wall mass and right gluteal skin lesion here in the office.  HPI, Physical Exam, Assessment and Plan have been scribed under the direction and in the presence of Robin Bellow, MD. Robin Fetch, RN  I have completed the exam and reviewed the above documentation for accuracy and completeness.  I agree with the above.  Haematologist has been used and any errors in dictation or transcription are unintentional.  Robin Wilkins, M.D., F.A.C.S.  Robin Wilkins 05/03/2017, 7:44 PM

## 2017-05-02 NOTE — Patient Instructions (Addendum)
The patient is aware to call back for any questions or concerns.  Recommend excisional biopsy right chest wall mass and right gluteal skin lesion in the office.

## 2017-05-03 DIAGNOSIS — R222 Localized swelling, mass and lump, trunk: Secondary | ICD-10-CM | POA: Insufficient documentation

## 2017-05-03 DIAGNOSIS — D229 Melanocytic nevi, unspecified: Secondary | ICD-10-CM | POA: Insufficient documentation

## 2017-05-16 ENCOUNTER — Ambulatory Visit: Payer: Managed Care, Other (non HMO) | Admitting: General Surgery

## 2017-05-18 ENCOUNTER — Ambulatory Visit: Payer: Managed Care, Other (non HMO) | Admitting: General Surgery

## 2017-06-01 ENCOUNTER — Ambulatory Visit: Payer: Managed Care, Other (non HMO) | Admitting: General Surgery

## 2017-06-01 ENCOUNTER — Ambulatory Visit (INDEPENDENT_AMBULATORY_CARE_PROVIDER_SITE_OTHER): Payer: Managed Care, Other (non HMO) | Admitting: General Surgery

## 2017-06-01 VITALS — BP 130/68 | HR 96 | Resp 12 | Ht 68.0 in | Wt 241.0 lb

## 2017-06-01 DIAGNOSIS — D213 Benign neoplasm of connective and other soft tissue of thorax: Secondary | ICD-10-CM | POA: Diagnosis not present

## 2017-06-01 DIAGNOSIS — R222 Localized swelling, mass and lump, trunk: Secondary | ICD-10-CM

## 2017-06-01 DIAGNOSIS — D215 Benign neoplasm of connective and other soft tissue of pelvis: Secondary | ICD-10-CM

## 2017-06-01 DIAGNOSIS — D229 Melanocytic nevi, unspecified: Secondary | ICD-10-CM

## 2017-06-01 NOTE — Progress Notes (Signed)
Patient ID: Robin Wilkins, female   DOB: [birth year], 46 y.o.   MRN: [id number]  Chief Complaint  Patient presents with  . Procedure    HPI Robin Wilkins is a 46 y.o. female.  Here for excision right chest wall mass and right gluteal mass and left facial lesion.  HPI  Past Medical History:  Diagnosis Date  . Cancer [facility]) 2008   left breast, [facility]  . Genetic screening    negative per pt  . Hypertension     Past Surgical History:  Procedure Laterality Date  . ABDOMINAL HYSTERECTOMY     partial  . BACK SURGERY    . MASTECTOMY Bilateral 2008   chemo and rad  . PLACEMENT OF BREAST IMPLANTS Bilateral 2009   spacers  . PLACEMENT OF BREAST IMPLANTS Right 2009   left breast flap 2010  . TONSILLECTOMY AND ADENOIDECTOMY      Family History  Problem Relation Age of Onset  . Kidney cancer Mother   . Diabetes Mother   . Hypertension Mother   . Breast cancer Paternal Aunt        late 55's  . Breast cancer Paternal Grandmother        60's    Social History Social History  Substance Use Topics  . Smoking status: Never Smoker  . Smokeless tobacco: Never Used  . Alcohol use No    No Known Allergies  Current Outpatient Prescriptions  Medication Sig Dispense Refill  . cholecalciferol (VITAMIN D) 1000 units tablet Take 1 tablet (1,000 Units total) by mouth daily. 30 tablet 3  . cyclobenzaprine (FLEXERIL) 10 MG tablet Take 10 mg by mouth as needed.    Marland Kitchen losartan-hydrochlorothiazide (HYZAAR) 100-12.5 MG tablet Take by mouth.    . naproxen (EC NAPROSYN) 500 MG EC tablet Take 500 mg by mouth as needed.     . ondansetron (ZOFRAN ODT) 4 MG disintegrating tablet Allow 1-2 tablets to dissolve in your mouth every 8 hours as needed for nausea/vomiting 30 tablet 0  . tamoxifen (NOLVADEX) 10 MG tablet Take 2 tablets (20 mg total) by mouth daily. 60 tablet 3  . triamcinolone (KENALOG) 0.025 % cream APPLY TO AFFECTED AREA TWICE A DAY PRN    . venlafaxine XR (EFFEXOR-XR) 150  MG 24 hr capsule Take 150 mg by mouth daily.     No current facility-administered medications for this visit.     Review of Systems Review of Systems  Constitutional: Negative.   Respiratory: Negative.   Cardiovascular: Negative.   Neurological: Positive for headaches.    Blood pressure 130/68, pulse 96, resp. rate 12, height 5\' 8"  (1.727 m), weight 241 lb (109.3 kg), SpO2 98 %.  Physical Exam Physical Exam  HENT:  Head:    Pulmonary/Chest:      Data Reviewed 10 mL of 0.5% Xylocaine with 0.25% Marcaine with 1-200,000 epinephrine was used and well tolerated. ChloraPrep was applied to the skin. Transverse incision was made about 2 cm above the mastectomy incision. The skin and soft tissue is divided and a 2 cm diameter 1.5 cm thick area of fibrofatty tissue removed. No residual thickening. The deep tissue was approximated with interrupted 3-0 Vicryl figure-of-eight sutures. The skin was closed with a running 4-0 Vicryl suture. Benzoin, Steri-Strips, Telfa and tape dressing applied.  The right gluteal lesion was cleansed with alcohol followed by 10 mL of the above mentioned local anesthetic. Through elliptical incision the 5 mm multilobulated mole was removed. Deep tissue was approximated with  a single 3-0 Vicryl suture. The skin was closed with a running 4-0 Vicryl subcuticular suture. Benzoin, Steri-Strips, Telfa and Tegaderm dressing applied.  Assessment    The procedures were well tolerated.    Plan    The patient will be contacted when pathology is available. The small fascial skin tag was not sent for pathologic review.    Nurse visit in one week  HPI, Physical Exam, Assessment and Plan have been scribed under the direction and in the presence of Robert Bellow, MD. Karie Fetch, RN   Robert Bellow 06/01/2017, 8:04 PM

## 2017-06-01 NOTE — Patient Instructions (Addendum)
The patient is aware to call back for any questions or concerns. Scarfad or mederma to reduce scar May shower May remove dressing in 2-3 days Steri strips will gradually come off over 2-3 weeks May use an Ice pack as needed for comfort

## 2017-06-02 ENCOUNTER — Telehealth: Payer: Self-pay | Admitting: *Deleted

## 2017-06-02 NOTE — Telephone Encounter (Signed)
Patient called in this morning and states she woke up and notices some blood on her bandage at the chest wall area.  Advised patient it was okay as long as she don't noticed any more bleeding. If she noticed any bleeding to use her ice pack and apply pressure to the area for 10 minutes. Call the office if she has any other questions.

## 2017-06-05 ENCOUNTER — Encounter: Payer: Self-pay | Admitting: *Deleted

## 2017-06-08 ENCOUNTER — Ambulatory Visit (INDEPENDENT_AMBULATORY_CARE_PROVIDER_SITE_OTHER): Payer: Managed Care, Other (non HMO) | Admitting: *Deleted

## 2017-06-08 DIAGNOSIS — R222 Localized swelling, mass and lump, trunk: Secondary | ICD-10-CM

## 2017-06-08 NOTE — Patient Instructions (Signed)
Return as scheduled 

## 2017-06-08 NOTE — Progress Notes (Signed)
Patient came in today for a wound check.  Both wounds are clean, with no signs of infection noted. Follow up as needed. Aware of pathology. May use scar fade if desires.

## 2017-07-18 ENCOUNTER — Inpatient Hospital Stay: Payer: Managed Care, Other (non HMO)

## 2017-07-18 ENCOUNTER — Inpatient Hospital Stay: Payer: Managed Care, Other (non HMO) | Admitting: Oncology

## 2017-07-18 ENCOUNTER — Other Ambulatory Visit: Payer: Self-pay | Admitting: *Deleted

## 2017-07-18 DIAGNOSIS — R222 Localized swelling, mass and lump, trunk: Secondary | ICD-10-CM

## 2017-07-18 NOTE — Progress Notes (Deleted)
Hematology/Oncology Follow Up Note [facility] Telephone:([phone] Fax:([phone]  CONSULT NOTE Patient Care Team: Robin Crouch, MD as PCP - General (Internal Medicine) Robin Server, MD as Consulting Physician (Oncology) Robin Wilkins, Robin Gleason, MD (General Surgery)  CHIEF COMPLAINTS/PURPOSE OF CONSULTATION:  I have a history of breast cancer   HISTORY OF PRESENTING ILLNESS:  1 Robin Wilkins 46 y.o.  female with past medical history listed as below, most significant for a history of CANCER who was referred to Dr. Doy Wilkins to me for evaluation and management of patient's breast cancer treatment. Patient reports that she moved from [place]. In 2008 may she was diagnosed with left breast cancer. She remembered that she had an excisional biopsy of the breast lesion at first. She was told that it was hormone receptor positive and HER-2 positive. She underwent neoadjuvant chemotherapy July to October 2008. She had bilateral mastectomy on 05/09/2007. She also underwent radiation adjuvantly. She completed 1 year of Herceptin. She was started on tamoxifen in 2009 after she completed all her chemotherapy and radiation. Patient reports that the initial diagnosis, chemotherapy, and surgery were done at [place]. Later she transferred her care to [facility] and initially she followed up with Dr. Brigitte Wilkins once Dr. Manuella Wilkins retired she follows up with Dr. Corrie Wilkins. She has done genetic test, which was negative per patient.  Patient is single, she has 2 adult children, one boy and one girl. She has a history of hysterectomy.  2  04/06/2017 We've received incomplete medical records from [place]. In 2008, he initially presented with bilateral axillary lymph node adenopathy which lead to further workup. We do not have her mammogram and CT report. 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 ER 11% PR 1%, HER-2 overexpression.  Patient underwent PET scan on 02/01/2007 which showed solid hypermetabolic soft tissue mass in the left axilla consistent with metastatic adenopathy measuring 4.9 cm x 2.6 cm no other hypermetabolic foci demonstrated. The patient was started on neoadjuvant chemotherapy with AC/ T regimen. We do not have details about her chemotherapy regimen. Per patient she also completed 1 year of Herceptin. Per Dr. Patrice Wilkins, Robin Wilkins's note, the patient had great response to the new adjuvant chemotherapy. It was noted that her MRI which was done in 03/14/2007 showed no residual breast or axillary masses. We do not have the full MRI report.  Patient underwent bilateral mastectomy on 06/05/2007.  1 Right mastectomy with attached axillary tail showing focal increased of fibrous stroma, cystic dilatation of the ducts with columnar cell changes, foci of microcalcifications, mild to moderate ductal hyperplasia and the scattered foci of atypical lobular hyperplastic. No in situ carcinoma or invasive carcinoma identified. Skin and nipple of the right breast showing no diagnostic abnormality.  2 Left mastectomy with axillary tail showing organizing fat necrosis, and fibrosis around the previous surgical biopsy site 8 to 9:00 position, retroareolar, with foci of granulomatous changes with presence of many multinucleated giant cells. No residual carcinoma identified. Breast tissue away from the biopsy site showing focal stromal fibrosis, cystic dilatation of ducts with columnar cell changes, mild to moderate ductal hyperplastic, foci of sclerosing adenosis and atypical lobular hyperplastic.   3 level I and the level II left axillary lymph nodes showing metastatic and a moderately differentiated ductal carcinoma involving 4 of  the 8 lymph nodes, Matted, group of  lymph nodes measures 3.5 cm in greatest dimension. Focal fibrosis and the calcification noted probably related to the preoperative chemotherapy. Final TNM coating T2, N2a, NX.  (the original tumor was excisional biopsied).   Patient was started on tamoxifen 20 mg in 2009. Patient also had bilateral breast reconstruction. In Dr. Manson Wilkins note, she had an episode of clotting which threatened to be destroying the entire left breast reconstruction surgery she was treated with Lovenox, and then have evidence of worsening thrombosis and the vascularization, and he walks she was put on aspirin as well and then finally tamoxifen has been temporarily discontinued, which was later restarted. .  INTERVAL HISTORY Patient presents to further discuss about the management plan after records are obtained.    ROS:   Review of Systems  Constitutional: Negative.   HENT:  Negative.   Eyes: Negative.   Respiratory: Negative.   Cardiovascular: Negative.   Gastrointestinal: Negative.   Endocrine: Negative.   Genitourinary: Negative.    Musculoskeletal: Negative.   Skin: Negative.   Neurological: Negative.   Hematological: Negative.   Psychiatric/Behavioral: Negative.     MEDICAL HISTORY:  Past Medical History:  Diagnosis Date  . Cancer [facility]) 2008   left breast, [facility]  . Genetic screening    negative per pt  . Hypertension     SURGICAL HISTORY: Past Surgical History:  Procedure Laterality Date  . ABDOMINAL HYSTERECTOMY     partial  . BACK SURGERY    . MASTECTOMY Bilateral 2008   chemo and rad  . PLACEMENT OF BREAST IMPLANTS Bilateral 2009   spacers  . PLACEMENT OF BREAST IMPLANTS Right 2009   left breast flap 2010  . TONSILLECTOMY AND ADENOIDECTOMY      SOCIAL HISTORY: Social History   Socioeconomic History  . Marital status: Single    Spouse name: Not on file  . Number of children: Not on file  . Years of education: Not on file  . Highest education level: Not on file    Social Needs  . Financial resource strain: Not on file  . Food insecurity - worry: Not on file  . Food insecurity - inability: Not on file  . Transportation needs - medical: Not on file  . Transportation needs - non-medical: Not on file  Occupational History  . Not on file  Tobacco Use  . Smoking status: Never Smoker  . Smokeless tobacco: Never Used  Substance and Sexual Activity  . Alcohol use: No  . Drug use: No  . Sexual activity: Not Currently  Other Topics Concern  . Not on file  Social History Narrative  . Not on file    FAMILY HISTORY: Family History  Problem Relation Age of Onset  . Kidney cancer Mother   . Diabetes Mother   . Hypertension Mother   . Breast cancer Paternal Aunt        late 29's  . Breast cancer Paternal Grandmother        60's    ALLERGIES:  has No Known Allergies.  MEDICATIONS:  Current Outpatient Medications  Medication Sig Dispense Refill  . cholecalciferol (VITAMIN D) 1000 units tablet Take 1 tablet (1,000 Units total) by mouth daily. 30 tablet 3  . cyclobenzaprine (FLEXERIL) 10 MG tablet Take 10 mg by mouth as needed.    Marland Kitchen losartan-hydrochlorothiazide (HYZAAR) 100-12.5 MG tablet Take by mouth.    . naproxen (EC NAPROSYN) 500 MG EC tablet Take 500 mg  by mouth as needed.     . ondansetron (ZOFRAN ODT) 4 MG disintegrating tablet Allow 1-2 tablets to dissolve in your mouth every 8 hours as needed for nausea/vomiting 30 tablet 0  . tamoxifen (NOLVADEX) 10 MG tablet Take 2 tablets (20 mg total) by mouth daily. 60 tablet 3  . triamcinolone (KENALOG) 0.025 % cream APPLY TO AFFECTED AREA TWICE A DAY PRN    . venlafaxine XR (EFFEXOR-XR) 150 MG 24 hr capsule Take 150 mg by mouth daily.     No current facility-administered medications for this visit.       Marland Kitchen  PHYSICAL EXAMINATION: ECOG PERFORMANCE STATUS: 0 - Asymptomatic There were no vitals filed for this visit. There were no vitals filed for this visit.  GENERAL: Well-nourished  well-developed; Alert, no distress and comfortable.  EYES: no pallor or icterus OROPHARYNX: no thrush or ulceration; good dentition  NECK: supple, no masses felt LYMPH:  no palpable lymphadenopathy in the cervical, axillary or inguinal regions LUNGS: clear to auscultation and  No wheeze or crackles HEART/CVS: regular rate & rhythm and no murmurs; No lower extremity edema ABDOMEN: abdomen soft, non-tender and normal bowel sounds Musculoskeletal:no cyanosis of digits and no clubbing  PSYCH: alert & oriented x 3  NEURO: no focal motor/sensory deficits SKIN:  no rashes or significant lesions Breast exam is performed in seated and lying down position. Patient is status post bilateral mastectomy with reconstruction. The implant edges are intact. Small mobile nodule palpated 2'o clock.   No evidence of bilateral axillary adenopathy.   LABORATORY DATA:  I have reviewed the data as listed Lab Results  Component Value Date   WBC 8.4 04/04/2017   HGB 11.8 (L) 04/04/2017   HCT 35.5 04/04/2017   MCV 79.0 (L) 04/04/2017   PLT 277 04/04/2017   Recent Labs    04/04/17 1021  NA 138  K 3.4*  CL 102  CO2 29  GLUCOSE 90  BUN 8  CREATININE 0.69  CALCIUM 9.1  GFRNONAA >60  GFRAA >60  PROT 7.0  ALBUMIN 3.9  AST 23  ALT 19  ALKPHOS 67  BILITOT 0.5   Vitamin D 17.4  RADIOGRAPHIC STUDIES: I have personally reviewed the radiological images as listed and agreed with the findings in the report. No results found.  ASSESSMENT & PLAN:  No diagnosis found. # Discussed with patient that we will need to obtain medical records from both [place] and [facility] in [place]. Once we have the records out see her again and finalize management plan.Recommend continuing tamoxifen to complete 10 years. Her menopause state is hard to determine as she had hysterectomy. She has had hormone levels tested with her previous oncologist and was non conclusive. Will refer her to  GYN to determine her menopause state. If she is truly postmenopause, can switch to Aromatase inhibitor.   # Microcytosis. Iron panel reviewed. No iron deficiency.  Microcytosis is very mild.   #Her vitamin D level was checked and was mildly low at 17.4. She asked for a Vitamin D prescription. Rx of Vitamin D 1000 unit daily sent to her pharmacy.   #Chest wall nodule, unknown etiology. Per patient has been there chronically. The records we obtained did not mention any work up for this nodule.  Will refer to Dr.Byrnett for evaluation.   All questions were answered. The patient knows to call the clinic with any problems questions or concerns.  Return of visit: 3 months w labs. Thank  you for this kind referral and the opportunity to participate in the care of this patient. A copy of today's note is routed to referring provider Dr.Sparks.    Robin Server, MD, PhD Hematology Oncology [facility] at [facility] Pager- [phone] 07/18/2017

## 2017-07-27 ENCOUNTER — Other Ambulatory Visit: Payer: Managed Care, Other (non HMO)

## 2017-07-27 ENCOUNTER — Ambulatory Visit: Payer: Managed Care, Other (non HMO) | Admitting: Oncology

## 2017-07-28 ENCOUNTER — Inpatient Hospital Stay: Payer: Managed Care, Other (non HMO) | Attending: Oncology

## 2017-07-28 ENCOUNTER — Other Ambulatory Visit: Payer: Self-pay

## 2017-07-28 ENCOUNTER — Inpatient Hospital Stay (HOSPITAL_BASED_OUTPATIENT_CLINIC_OR_DEPARTMENT_OTHER): Payer: Managed Care, Other (non HMO) | Admitting: Oncology

## 2017-07-28 ENCOUNTER — Encounter: Payer: Self-pay | Admitting: Oncology

## 2017-07-28 VITALS — BP 143/88 | HR 88 | Temp 95.4°F | Resp 18 | Wt 246.5 lb

## 2017-07-28 DIAGNOSIS — Z79899 Other long term (current) drug therapy: Secondary | ICD-10-CM

## 2017-07-28 DIAGNOSIS — Z9221 Personal history of antineoplastic chemotherapy: Secondary | ICD-10-CM | POA: Insufficient documentation

## 2017-07-28 DIAGNOSIS — Z170 Estrogen receptor positive status [ER+]: Secondary | ICD-10-CM | POA: Diagnosis not present

## 2017-07-28 DIAGNOSIS — Z923 Personal history of irradiation: Secondary | ICD-10-CM

## 2017-07-28 DIAGNOSIS — Z9013 Acquired absence of bilateral breasts and nipples: Secondary | ICD-10-CM | POA: Insufficient documentation

## 2017-07-28 DIAGNOSIS — Z79810 Long term (current) use of selective estrogen receptor modulators (SERMs): Secondary | ICD-10-CM

## 2017-07-28 DIAGNOSIS — Z90710 Acquired absence of both cervix and uterus: Secondary | ICD-10-CM | POA: Diagnosis not present

## 2017-07-28 DIAGNOSIS — Z853 Personal history of malignant neoplasm of breast: Secondary | ICD-10-CM

## 2017-07-28 DIAGNOSIS — C50912 Malignant neoplasm of unspecified site of left female breast: Secondary | ICD-10-CM

## 2017-07-28 DIAGNOSIS — Z8051 Family history of malignant neoplasm of kidney: Secondary | ICD-10-CM | POA: Insufficient documentation

## 2017-07-28 DIAGNOSIS — Z803 Family history of malignant neoplasm of breast: Secondary | ICD-10-CM

## 2017-07-28 DIAGNOSIS — I10 Essential (primary) hypertension: Secondary | ICD-10-CM | POA: Insufficient documentation

## 2017-07-28 DIAGNOSIS — Z800 Family history of malignant neoplasm of digestive organs: Secondary | ICD-10-CM | POA: Insufficient documentation

## 2017-07-28 DIAGNOSIS — R222 Localized swelling, mass and lump, trunk: Secondary | ICD-10-CM

## 2017-07-28 LAB — COMPREHENSIVE METABOLIC PANEL
ALK PHOS: 67 U/L (ref 38–126)
ALT: 14 U/L (ref 14–54)
AST: 23 U/L (ref 15–41)
Albumin: 3.7 g/dL (ref 3.5–5.0)
Anion gap: 6 (ref 5–15)
BUN: 9 mg/dL (ref 6–20)
CALCIUM: 8.6 mg/dL — AB (ref 8.9–10.3)
CO2: 27 mmol/L (ref 22–32)
CREATININE: 0.58 mg/dL (ref 0.44–1.00)
Chloride: 104 mmol/L (ref 101–111)
Glucose, Bld: 120 mg/dL — ABNORMAL HIGH (ref 65–99)
Potassium: 3.2 mmol/L — ABNORMAL LOW (ref 3.5–5.1)
Sodium: 137 mmol/L (ref 135–145)
Total Bilirubin: 0.6 mg/dL (ref 0.3–1.2)
Total Protein: 6.6 g/dL (ref 6.5–8.1)

## 2017-07-28 LAB — CBC WITH DIFFERENTIAL/PLATELET
BASOS PCT: 1 %
Basophils Absolute: 0.1 10*3/uL (ref 0–0.1)
EOS ABS: 0 10*3/uL (ref 0–0.7)
Eosinophils Relative: 1 %
HCT: 35.4 % (ref 35.0–47.0)
HEMOGLOBIN: 11.6 g/dL — AB (ref 12.0–16.0)
LYMPHS ABS: 2 10*3/uL (ref 1.0–3.6)
Lymphocytes Relative: 35 %
MCH: 26.1 pg (ref 26.0–34.0)
MCHC: 32.8 g/dL (ref 32.0–36.0)
MCV: 79.5 fL — ABNORMAL LOW (ref 80.0–100.0)
MONOS PCT: 7 %
Monocytes Absolute: 0.4 10*3/uL (ref 0.2–0.9)
NEUTROS PCT: 56 %
Neutro Abs: 3.4 10*3/uL (ref 1.4–6.5)
PLATELETS: 258 10*3/uL (ref 150–440)
RBC: 4.45 MIL/uL (ref 3.80–5.20)
RDW: 14 % (ref 11.5–14.5)
WBC: 5.9 10*3/uL (ref 3.6–11.0)

## 2017-07-28 MED ORDER — MELATONIN 2.5 MG PO CAPS: 2.5000 mg | ORAL_CAPSULE | Freq: Every evening | ORAL | 0 refills | Status: AC | PRN

## 2017-07-28 MED ORDER — TAMOXIFEN CITRATE 10 MG PO TABS: 20.0000 mg | ORAL_TABLET | Freq: Every day | ORAL | 3 refills | Status: DC

## 2017-07-28 NOTE — Progress Notes (Signed)
Here for follow up. Overall doing well. Difficulty falling asleep she stated.

## 2017-07-28 NOTE — Progress Notes (Signed)
Hematology/Oncology Follow Up Note [facility] [phone] Fax:([phone]  Patient Care Team: [name], MD as PCP - General (Internal Medicine) [name], MD as Consulting Physician (Oncology) [name], [name], MD (General Surgery)  CHIEF COMPLAINTS/PURPOSE OF CONSULTATION:  I have a history of breast cancer  HISTORY OF PRESENTING ILLNESS:  1 Robin Wilkins 46 y.o.  female with past medical history listed as below, most significant for a history of breast cancer who was referred to Dr. [name] to me for evaluation and management of patient's breast cancer treatment. Patient reports that she moved from [place]. In 2008 may she was diagnosed with left breast cancer. She remembered that she had an excisional biopsy of the breast lesion at first. She was told that it was hormone receptor positive and HER-2 positive. She underwent neoadjuvant chemotherapy July to October 2008. She had bilateral mastectomy on 05/09/2007. She also underwent radiation adjuvantly. She completed 1 year of Herceptin. She was started on tamoxifen in 2009 after she completed all her chemotherapy and radiation. Patient reports that the initial diagnosis, chemotherapy, and surgery were done at [place]. Later she transferred her care to [facility] and initially she followed up with Dr. [name] once Dr. [name] retired she follows up with Dr. [name]. She has done genetic test, which was negative per patient.  Patient is single, she has 2 adult children, one boy and one girl. She has a history of hysterectomy.  2  04/06/2017 We've received incomplete medical records from [place]. In 2008, he initially presented with bilateral axillary lymph node adenopathy which lead to further workup. We do not have her mammogram and CT report. 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 ER 11% PR 1%, HER-2 overexpression.  Patient underwent PET scan on 02/01/2007 which showed solid hypermetabolic soft tissue mass in the left axilla consistent with metastatic adenopathy measuring 4.9 cm x 2.6 cm no other hypermetabolic foci demonstrated. The patient was started on neoadjuvant chemotherapy with AC/ T regimen. We do not have details about her chemotherapy regimen. Per patient she also completed 1 year of Herceptin. Per Dr. [name], [name]'s note, the patient had great response to the new adjuvant chemotherapy. It was noted that her MRI which was done in 03/14/2007 showed no residual breast or axillary masses. We do not have the full MRI report.  Patient underwent bilateral mastectomy on 06/05/2007.  1 Right mastectomy with attached axillary tail showing focal increased of fibrous stroma, cystic dilatation of the ducts with columnar cell changes, foci of microcalcifications, mild to moderate ductal hyperplasia and the scattered foci of atypical lobular hyperplastic. No in situ carcinoma or invasive carcinoma identified. Skin and nipple of the right breast showing no diagnostic abnormality.  2 Left mastectomy with axillary tail showing organizing fat necrosis, and fibrosis around the previous surgical biopsy site 8 to 9:00 position, retroareolar, with foci of granulomatous changes with presence of many multinucleated giant cells. No residual carcinoma identified. Breast tissue away from the biopsy site showing focal stromal fibrosis, cystic dilatation of ducts with columnar cell changes, mild to moderate ductal hyperplastic, foci of sclerosing adenosis and atypical lobular hyperplastic.   3 level I and the level II left axillary lymph nodes showing metastatic and a moderately differentiated ductal carcinoma involving 4 of the 8  lymph nodes, Matted, group of lymph nodes  measures 3.5 cm in greatest dimension. Focal fibrosis and the calcification noted probably related to the preoperative chemotherapy. Final TNM coating T2, N2a, NX.  (the original tumor was excisional biopsied).   Patient was started on tamoxifen 20 mg in 2009. Patient also had bilateral breast reconstruction. In Dr. [name] note, she had an episode of clotting which threatened to be destroying the entire left breast reconstruction surgery she was treated with Lovenox, and then have evidence of worsening thrombosis and the vascularization, and he walks she was put on aspirin as well and then finally tamoxifen has been temporarily discontinued, which was later restarted. .  INTERVAL HISTORY Patient presents for follow up of management of breast cancer. She is compliant with tamoxifen and tolerated well. During interval she was seen by surgery and have chest wall nodule removed which showed lipoma. She also went for GYN appointment. She was not actually seen by GYN but had labs done. She is not in menopause state yet.   ROS:   Review of Systems  Constitutional: Negative for appetite change and chills.  HENT:   Negative for hearing loss and lump/mass.   Eyes: Negative for eye problems.  Respiratory: Negative for chest tightness.   Cardiovascular: Negative for chest pain.  Gastrointestinal: Negative for abdominal distention.  Endocrine: Negative for hot flashes.  Genitourinary: Negative for bladder incontinence and difficulty urinating.   Musculoskeletal: Negative for arthralgias.  Skin: Negative for itching.  Neurological: Negative.  Negative for dizziness.  Hematological: Negative for adenopathy.  Psychiatric/Behavioral: The patient is not nervous/anxious.     MEDICAL HISTORY:  Past Medical History:  Diagnosis Date  . Cancer [facility]) 2008   left breast, [facility]  . Genetic screening    negative per pt  . Hypertension     SURGICAL HISTORY: Past Surgical History:  Procedure  Laterality Date  . ABDOMINAL HYSTERECTOMY     partial  . BACK SURGERY    . MASTECTOMY Bilateral 2008   chemo and rad  . PLACEMENT OF BREAST IMPLANTS Bilateral 2009   spacers  . PLACEMENT OF BREAST IMPLANTS Right 2009   left breast flap 2010  . TONSILLECTOMY AND ADENOIDECTOMY      SOCIAL HISTORY: Social History   Socioeconomic History  . Marital status: Single    Spouse name: Not on file  . Number of children: Not on file  . Years of education: Not on file  . Highest education level: Not on file  Social Needs  . Financial resource strain: Not on file  . Food insecurity - worry: Not on file  . Food insecurity - inability: Not on file  . Transportation needs - medical: Not on file  . Transportation needs - non-medical: Not on file  Occupational History  . Not on file  Tobacco Use  . Smoking status: Never Smoker  . Smokeless tobacco: Never Used  Substance and Sexual Activity  . Alcohol use: No  . Drug use: No  . Sexual activity: Not Currently  Other Topics Concern  . Not on file  Social History Narrative  . Not on file    FAMILY HISTORY: Family History  Problem Relation Age of Onset  . Kidney cancer Mother   . Diabetes Mother   . Hypertension Mother   . Breast cancer Paternal Aunt        late 47's  . Breast cancer Paternal Grandmother        52's  ALLERGIES:  has No Known Allergies.  MEDICATIONS:  Current Outpatient Medications  Medication Sig Dispense Refill  . cholecalciferol (VITAMIN D) 1000 units tablet Take 1 tablet (1,000 Units total) by mouth daily. 30 tablet 3  . cyclobenzaprine (FLEXERIL) 10 MG tablet Take 10 mg by mouth as needed.    Marland Kitchen losartan-hydrochlorothiazide (HYZAAR) 100-12.5 MG tablet Take by mouth.    . naproxen (EC NAPROSYN) 500 MG EC tablet Take 500 mg by mouth as needed.     . ondansetron (ZOFRAN ODT) 4 MG disintegrating tablet Allow 1-2 tablets to dissolve in your mouth every 8 hours as needed for nausea/vomiting 30 tablet 0  .  tamoxifen (NOLVADEX) 10 MG tablet Take 2 tablets (20 mg total) by mouth daily. 60 tablet 3  . triamcinolone (KENALOG) 0.025 % cream APPLY TO AFFECTED AREA TWICE A DAY PRN    . venlafaxine XR (EFFEXOR-XR) 150 MG 24 hr capsule Take 150 mg by mouth daily.     No current facility-administered medications for this visit.       Marland Kitchen  PHYSICAL EXAMINATION: ECOG PERFORMANCE STATUS: 0 - Asymptomatic Vitals:   07/28/17 0839  BP: (!) 143/88  Pulse: 88  Resp: 18  Temp: (!) 95.4 F (35.2 C)   Filed Weights   07/28/17 0839  Weight: 246 lb 8 oz (111.8 kg)    GENERAL: No distress, well nourished.  SKIN:  No rashes or significant lesions  HEAD: Normocephalic, No masses, lesions, tenderness or abnormalities  EYES: Conjunctiva are pink, non icteric ENT: External ears normal ,lips , buccal mucosa, and tongue normal and mucous membranes are moist  LYMPH: No palpable cervical and axillary lymphadenopathy  LUNGS: Clear to auscultation, no crackles or wheezes HEART: Regular rate & rhythm, no murmurs, no gallops, S1 normal and S2 normal  ABDOMEN: Abdomen soft, non-tender, normal bowel sounds, I did not appreciate any  masses or organomegaly  MUSCULOSKELETAL: No CVA tenderness and no tenderness on percussion of the back or rib cage.  EXTREMITIES: No edema, no skin discoloration or tenderness NEURO: Alert & oriented, no focal motor/sensory deficits. Breast exam is performed in seated and lying down position. Patient is status post bilateral mastectomy with reconstruction. The implant edges are intact. No evidence of bilateral axillary adenopathy.   LABORATORY DATA:  I have reviewed the data as listed Lab Results  Component Value Date   WBC 5.9 07/28/2017   HGB 11.6 (L) 07/28/2017   HCT 35.4 07/28/2017   MCV 79.5 (L) 07/28/2017   PLT 258 07/28/2017   Recent Labs    04/04/17 1021  NA 138  K 3.4*  CL 102  CO2 29  GLUCOSE 90  BUN 8  CREATININE 0.69  CALCIUM 9.1  GFRNONAA >60  GFRAA >60    PROT 7.0  ALBUMIN 3.9  AST 23  ALT 19  ALKPHOS 67  BILITOT 0.5   Vitamin D 17.4  ASSESSMENT & PLAN:  1. History of breast cancer   2. Long-term current use of tamoxifen   # continue tamoxifen to complete 10 years course. Refills sent to pharmacy.    # Persistent mild microcytosis.  Iron panel was previously reviewed. No iron deficiency. Patient has family history of colon cancer. Advised patient to follow up with GI. Given her family history of colon cancer and personal history of breast cancer, I will refer her to genetic counseling.  All questions were answered. The patient knows to call the clinic with any problems questions or concerns.  Return of visit:  3 months w labs.  [name], MD, PhD Hematology Oncology [facility] Pager- [phone] 07/28/2017

## 2017-08-02 ENCOUNTER — Other Ambulatory Visit: Payer: Self-pay | Admitting: Oncology

## 2017-10-27 ENCOUNTER — Ambulatory Visit: Payer: Managed Care, Other (non HMO) | Admitting: Oncology

## 2017-10-27 ENCOUNTER — Other Ambulatory Visit: Payer: Managed Care, Other (non HMO)

## 2017-11-06 ENCOUNTER — Ambulatory Visit: Payer: Managed Care, Other (non HMO) | Admitting: Oncology

## 2017-11-06 ENCOUNTER — Other Ambulatory Visit: Payer: Managed Care, Other (non HMO)

## 2017-11-09 ENCOUNTER — Other Ambulatory Visit: Payer: Self-pay | Admitting: *Deleted

## 2017-11-09 DIAGNOSIS — R222 Localized swelling, mass and lump, trunk: Secondary | ICD-10-CM

## 2017-11-10 ENCOUNTER — Inpatient Hospital Stay: Payer: Managed Care, Other (non HMO)

## 2017-11-10 ENCOUNTER — Encounter: Payer: Self-pay | Admitting: Oncology

## 2017-11-10 ENCOUNTER — Other Ambulatory Visit: Payer: Self-pay

## 2017-11-10 ENCOUNTER — Inpatient Hospital Stay: Payer: Managed Care, Other (non HMO) | Attending: Oncology | Admitting: Oncology

## 2017-11-10 VITALS — BP 122/81 | HR 83 | Temp 98.9°F | Wt 236.6 lb

## 2017-11-10 DIAGNOSIS — Z79810 Long term (current) use of selective estrogen receptor modulators (SERMs): Secondary | ICD-10-CM

## 2017-11-10 DIAGNOSIS — Z800 Family history of malignant neoplasm of digestive organs: Secondary | ICD-10-CM | POA: Insufficient documentation

## 2017-11-10 DIAGNOSIS — I10 Essential (primary) hypertension: Secondary | ICD-10-CM | POA: Insufficient documentation

## 2017-11-10 DIAGNOSIS — C50911 Malignant neoplasm of unspecified site of right female breast: Secondary | ICD-10-CM

## 2017-11-10 DIAGNOSIS — Z923 Personal history of irradiation: Secondary | ICD-10-CM | POA: Insufficient documentation

## 2017-11-10 DIAGNOSIS — Z9013 Acquired absence of bilateral breasts and nipples: Secondary | ICD-10-CM | POA: Diagnosis not present

## 2017-11-10 DIAGNOSIS — Z90710 Acquired absence of both cervix and uterus: Secondary | ICD-10-CM | POA: Diagnosis not present

## 2017-11-10 DIAGNOSIS — Z79899 Other long term (current) drug therapy: Secondary | ICD-10-CM | POA: Insufficient documentation

## 2017-11-10 DIAGNOSIS — Z9221 Personal history of antineoplastic chemotherapy: Secondary | ICD-10-CM | POA: Insufficient documentation

## 2017-11-10 DIAGNOSIS — R222 Localized swelling, mass and lump, trunk: Secondary | ICD-10-CM

## 2017-11-10 DIAGNOSIS — Z170 Estrogen receptor positive status [ER+]: Secondary | ICD-10-CM | POA: Insufficient documentation

## 2017-11-10 DIAGNOSIS — Z853 Personal history of malignant neoplasm of breast: Secondary | ICD-10-CM

## 2017-11-10 LAB — CBC WITH DIFFERENTIAL/PLATELET
BASOS ABS: 0 10*3/uL (ref 0–0.1)
BASOS PCT: 1 %
EOS PCT: 1 %
Eosinophils Absolute: 0 10*3/uL (ref 0–0.7)
HEMATOCRIT: 36.9 % (ref 35.0–47.0)
Hemoglobin: 12.2 g/dL (ref 12.0–16.0)
LYMPHS PCT: 34 %
Lymphs Abs: 2.8 10*3/uL (ref 1.0–3.6)
MCH: 26.1 pg (ref 26.0–34.0)
MCHC: 33 g/dL (ref 32.0–36.0)
MCV: 79 fL — AB (ref 80.0–100.0)
Monocytes Absolute: 0.6 10*3/uL (ref 0.2–0.9)
Monocytes Relative: 7 %
NEUTROS ABS: 4.7 10*3/uL (ref 1.4–6.5)
Neutrophils Relative %: 57 %
PLATELETS: 260 10*3/uL (ref 150–440)
RBC: 4.67 MIL/uL (ref 3.80–5.20)
RDW: 14.3 % (ref 11.5–14.5)
WBC: 8.1 10*3/uL (ref 3.6–11.0)

## 2017-11-10 LAB — COMPREHENSIVE METABOLIC PANEL
ALK PHOS: 63 U/L (ref 38–126)
ALT: 17 U/L (ref 14–54)
ANION GAP: 7 (ref 5–15)
AST: 20 U/L (ref 15–41)
Albumin: 4.1 g/dL (ref 3.5–5.0)
BILIRUBIN TOTAL: 0.8 mg/dL (ref 0.3–1.2)
BUN: 10 mg/dL (ref 6–20)
CALCIUM: 9.2 mg/dL (ref 8.9–10.3)
CO2: 27 mmol/L (ref 22–32)
Chloride: 104 mmol/L (ref 101–111)
Creatinine, Ser: 0.72 mg/dL (ref 0.44–1.00)
GFR calc Af Amer: >60 mL/min (ref >60)
Glucose, Bld: 85 mg/dL (ref 65–99)
POTASSIUM: 3.4 mmol/L — AB (ref 3.5–5.1)
Sodium: 138 mmol/L (ref 135–145)
TOTAL PROTEIN: 6.9 g/dL (ref 6.5–8.1)

## 2017-11-10 MED ORDER — TAMOXIFEN CITRATE 10 MG PO TABS: 20.0000 mg | ORAL_TABLET | Freq: Every day | ORAL | 3 refills | Status: DC

## 2017-11-10 NOTE — Progress Notes (Signed)
Hematology/Oncology Follow Up Note [facility] [phone] [phone]  Patient Care Team: [name], MD as PCP - General (Internal Medicine) [name], MD as Consulting Physician (Oncology) [name], [name], MD (General Surgery)  CHIEF COMPLAINTS/PURPOSE OF CONSULTATION:  I have a history of breast cancer  HISTORY OF PRESENTING ILLNESS:  1 Robin Wilkins 47 y.o.  female with past medical history listed as below, most significant for a history of breast cancer who was referred to Dr. [name] to me for evaluation and management of patient's breast cancer treatment. Patient reports that she moved from [place]. In 2008 may she was diagnosed with left breast cancer. She remembered that she had an excisional biopsy of the breast lesion at first. She was told that it was hormone receptor positive and HER-2 positive. She underwent neoadjuvant chemotherapy July to October 2008. She had bilateral mastectomy on 05/09/2007. She also underwent radiation adjuvantly. She completed 1 year of Herceptin. She was started on tamoxifen in 2009 after she completed all her chemotherapy and radiation. Patient reports that the initial diagnosis, chemotherapy, and surgery were done at [facility]. Later she transferred her care to [facility] and initially she followed up with Dr. [name] once Dr. [name] retired she follows up with Dr. [name]. She has done genetic test, which was negative per patient.  Patient is single, she has 2 adult children, one boy and one girl. She has a history of hysterectomy.  2  04/06/2017 We've received incomplete medical records from [place]. In 2008, he initially presented with bilateral axillary lymph node adenopathy which lead to further workup. We do not have her mammogram and CT report. 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 ER 11% PR 1%, HER-2 overexpression.  Patient underwent PET scan on 02/01/2007 which showed solid hypermetabolic soft tissue mass in the left axilla consistent with metastatic adenopathy measuring 4.9 cm x 2.6 cm no other hypermetabolic foci demonstrated. The patient was started on neoadjuvant chemotherapy with AC/ T regimen. We do not have details about her chemotherapy regimen. Per patient she also completed 1 year of Herceptin. Per Dr. [name], [name]'s note, the patient had great response to the new adjuvant chemotherapy. It was noted that her MRI which was done in 03/14/2007 showed no residual breast or axillary masses. We do not have the full MRI report.  Patient underwent bilateral mastectomy on 06/05/2007.  1 Right mastectomy with attached axillary tail showing focal increased of fibrous stroma, cystic dilatation of the ducts with columnar cell changes, foci of microcalcifications, mild to moderate ductal hyperplasia and the scattered foci of atypical lobular hyperplastic. No in situ carcinoma or invasive carcinoma identified. Skin and nipple of the right breast showing no diagnostic abnormality.  2 Left mastectomy with axillary tail showing organizing fat necrosis, and fibrosis around the previous surgical biopsy site 8 to 9:00 position, retroareolar, with foci of granulomatous changes with presence of many multinucleated giant cells. No residual carcinoma identified. Breast tissue away from the biopsy site showing focal stromal fibrosis, cystic dilatation of ducts with columnar cell changes, mild to moderate ductal hyperplastic, foci of sclerosing adenosis and atypical lobular hyperplastic.   3 level I and the level II left axillary lymph nodes showing metastatic and a moderately differentiated ductal carcinoma involving 4 of the 8  lymph nodes, Matted, group of lymph nodes  measures 3.5 cm in greatest dimension. Focal fibrosis and the calcification noted probably related to the preoperative chemotherapy. Final TNM coating T2, N2a, Mx (the original tumor was excisional biopsied).   Patient was started on tamoxifen 20 mg in September 2009. Patient also had bilateral breast reconstruction. In Dr. [name] note, she had an episode of clotting which threatened to be destroying the entire left breast reconstruction surgery she was treated with Lovenox, and then have evidence of worsening thrombosis and the vascularization, and he walks she was put on aspirin as well and then finally tamoxifen has been temporarily discontinued, which was later restarted. .  #  she was seen by surgery and have chest wall nodule removed which showed lipoma. # She also went for GYN appointment. She was not actually seen by GYN but had estradiol FSH levels checked . She is not in menopause state yet.  INTERVAL HISTORY Patient presents for follow up of management of breast cancer. She is compliant with tamoxifen and tolerated well.   she has no complaints today. During interval   ROS:   Review of Systems  Constitutional: Negative for appetite change, chills, fatigue and fever.  HENT:   Negative for hearing loss and lump/mass.   Eyes: Negative for eye problems.  Respiratory: Negative for chest tightness and shortness of breath.   Cardiovascular: Negative for chest pain and leg swelling.  Gastrointestinal: Negative for abdominal distention.  Endocrine: Negative for hot flashes.  Genitourinary: Negative for bladder incontinence, difficulty urinating, frequency and hematuria.   Musculoskeletal: Negative for arthralgias and flank pain.  Skin: Negative for itching and rash.  Neurological: Negative.  Negative for dizziness.  Hematological: Negative for adenopathy.  Psychiatric/Behavioral: Negative for confusion. The patient is not nervous/anxious.     MEDICAL HISTORY:  Past Medical History:    Diagnosis Date  . Cancer [facility]) 2008   left breast, [facility]  . Genetic screening    negative per pt  . Hypertension     SURGICAL HISTORY: Past Surgical History:  Procedure Laterality Date  . ABDOMINAL HYSTERECTOMY     partial  . BACK SURGERY    . MASTECTOMY Bilateral 2008   chemo and rad  . PLACEMENT OF BREAST IMPLANTS Bilateral 2009   spacers  . PLACEMENT OF BREAST IMPLANTS Right 2009   left breast flap 2010  . TONSILLECTOMY AND ADENOIDECTOMY      SOCIAL HISTORY: Social History   Socioeconomic History  . Marital status: Single    Spouse name: Not on file  . Number of children: Not on file  . Years of education: Not on file  . Highest education level: Not on file  Occupational History  . Not on file  Social Needs  . Financial resource strain: Not on file  . Food insecurity:    Worry: Not on file    Inability: Not on file  . Transportation needs:    Medical: Not on file    Non-medical: Not on file  Tobacco Use  . Smoking status: Never Smoker  . Smokeless tobacco: Never Used  Substance and Sexual Activity  . Alcohol use: No  . Drug use: No  . Sexual activity: Not Currently  Lifestyle  . Physical activity:    Days per week: Not on file    Minutes per session: Not on file  . Stress: Not on file  Relationships  . Social connections:    Talks on phone: Not on file  Gets together: Not on file    Attends religious service: Not on file    Active member of club or organization: Not on file    Attends meetings of clubs or organizations: Not on file    Relationship status: Not on file  . Intimate partner violence:    Fear of current or ex partner: Not on file    Emotionally abused: Not on file    Physically abused: Not on file    Forced sexual activity: Not on file  Other Topics Concern  . Not on file  Social History Narrative  . Not on file    FAMILY HISTORY: Family History  Problem Relation Age of Onset  . Kidney cancer Mother   .  Diabetes Mother   . Hypertension Mother   . Breast cancer Paternal Aunt        late 43's  . Breast cancer Paternal Grandmother        60's    ALLERGIES:  has No Known Allergies.  MEDICATIONS:  Current Outpatient Medications  Medication Sig Dispense Refill  . cholecalciferol (VITAMIN D) 1000 units tablet TAKE 1 TABLET BY MOUTH EVERY DAY 100 tablet 0  . cyclobenzaprine (FLEXERIL) 10 MG tablet Take 10 mg by mouth as needed.    Marland Kitchen losartan-hydrochlorothiazide (HYZAAR) 100-12.5 MG tablet Take by mouth.    . naproxen (EC NAPROSYN) 500 MG EC tablet Take 500 mg by mouth as needed.     . ondansetron (ZOFRAN ODT) 4 MG disintegrating tablet Allow 1-2 tablets to dissolve in your mouth every 8 hours as needed for nausea/vomiting 30 tablet 0  . tamoxifen (NOLVADEX) 10 MG tablet Take 2 tablets (20 mg total) by mouth daily. 60 tablet 3  . triamcinolone (KENALOG) 0.025 % cream APPLY TO AFFECTED AREA TWICE A DAY PRN    . venlafaxine XR (EFFEXOR-XR) 150 MG 24 hr capsule Take 150 mg by mouth daily.    . Melatonin 2.5 MG CAPS Take 1 capsule (2.5 mg total) by mouth at bedtime as needed (insomnia). (Patient not taking: Reported on 11/10/2017) 30 capsule 0   No current facility-administered medications for this visit.       Marland Kitchen  PHYSICAL EXAMINATION: ECOG PERFORMANCE STATUS: 0 - Asymptomatic Vitals:   11/10/17 1343 11/10/17 1344  BP: 122/81   Pulse:  83  Temp:  98.9 F (37.2 C)   Filed Weights   11/10/17 1343  Weight: 236 lb 9 oz (107.3 kg)   GENERAL: No distress, well nourished.  SKIN:  No rashes or significant lesions  HEAD: Normocephalic, No masses, lesions, tenderness or abnormalities  EYES: Conjunctiva are pink, non icteric ENT: External ears normal ,lips , buccal mucosa, and tongue normal and mucous membranes are moist  LYMPH: No palpable cervical and axillary lymphadenopathy  LUNGS: Clear to auscultation, no crackles or wheezes HEART: Regular rate & rhythm, no murmurs, no gallops, S1  normal and S2 normal  ABDOMEN: Abdomen soft, non-tender, normal bowel sounds, I did not appreciate any  masses or organomegaly  MUSCULOSKELETAL: No CVA tenderness and no tenderness on percussion of the back or rib cage.  EXTREMITIES: No edema, no skin discoloration or tenderness NEURO: Alert & oriented, no focal motor/sensory deficits. Breast exam is performed in seated and lying down position. Patient is status post bilateral mastectomy with reconstruction. The implant edges are intact. No evidence of bilateral axillary adenopathy.   LABORATORY DATA:  I have reviewed the data as listed Lab Results  Component Value Date  WBC 8.1 11/10/2017   HGB 12.2 11/10/2017   HCT 36.9 11/10/2017   MCV 79.0 (L) 11/10/2017   PLT 260 11/10/2017   Recent Labs    04/04/17 1021 07/28/17 0820 11/10/17 1329  NA 138 137 138  K 3.4* 3.2* 3.4*  CL 102 104 104  CO2 _0 GLUCOSE 90 120* 85  BUN _1 CREATININE 0.69 0.58 0.72  CALCIUM 9.1 8.6* 9.2  GFRNONAA >60 >60 >60  GFRAA >60 >60 >60  PROT 7.0 6.6 6.9  ALBUMIN 3.9 3.7 4.1  AST _2 ALT _3 ALKPHOS 67 67 63  BILITOT 0.5 0.6 0.8   Vitamin D 17.4  ASSESSMENT & PLAN:  1. History of breast cancer   2. Long-term current use of tamoxifen   T2, N2a, Mx, ER 11% PR 1% HER2 positive  # continue tamoxifen to complete 10 years course. Refills sent to pharmacy. She will finish 10 years of Tamoxifen in September 2019.  # Microcytosis, iron panel was previously reviewed and she does not have iron deficiency. She has family history of Colon cancer and I have previously advised her establish care with GI.  Given her family history of colon cancer and personal history of breast cancer, I will refer her to genetic counseling.  All questions were answered. The patient knows to call the clinic with any problems questions or concerns.  Return of visit: 6 months w labs.  [name], MD, PhD Hematology Oncology [facility] at  [facility] Pager- [phone] 11/10/2017

## 2017-11-10 NOTE — Progress Notes (Signed)
Patient here today for follow up.   

## 2017-12-01 ENCOUNTER — Other Ambulatory Visit: Payer: Self-pay | Admitting: Oncology

## 2018-05-01 ENCOUNTER — Other Ambulatory Visit: Payer: Self-pay | Admitting: Oncology

## 2018-05-09 ENCOUNTER — Other Ambulatory Visit: Payer: Self-pay

## 2018-05-09 DIAGNOSIS — Z79810 Long term (current) use of selective estrogen receptor modulators (SERMs): Secondary | ICD-10-CM

## 2018-05-10 ENCOUNTER — Other Ambulatory Visit: Payer: Self-pay

## 2018-05-10 ENCOUNTER — Encounter: Payer: Self-pay | Admitting: Oncology

## 2018-05-10 ENCOUNTER — Inpatient Hospital Stay: Payer: Managed Care, Other (non HMO) | Attending: Oncology

## 2018-05-10 ENCOUNTER — Inpatient Hospital Stay (HOSPITAL_BASED_OUTPATIENT_CLINIC_OR_DEPARTMENT_OTHER): Payer: Managed Care, Other (non HMO) | Admitting: Oncology

## 2018-05-10 VITALS — BP 139/94 | HR 79 | Temp 99.6°F | Resp 18 | Wt 242.2 lb

## 2018-05-10 DIAGNOSIS — C773 Secondary and unspecified malignant neoplasm of axilla and upper limb lymph nodes: Secondary | ICD-10-CM | POA: Insufficient documentation

## 2018-05-10 DIAGNOSIS — Z800 Family history of malignant neoplasm of digestive organs: Secondary | ICD-10-CM | POA: Insufficient documentation

## 2018-05-10 DIAGNOSIS — R05 Cough: Secondary | ICD-10-CM

## 2018-05-10 DIAGNOSIS — Z803 Family history of malignant neoplasm of breast: Secondary | ICD-10-CM

## 2018-05-10 DIAGNOSIS — Z421 Encounter for breast reconstruction following mastectomy: Secondary | ICD-10-CM | POA: Diagnosis not present

## 2018-05-10 DIAGNOSIS — R059 Cough, unspecified: Secondary | ICD-10-CM

## 2018-05-10 DIAGNOSIS — Z79810 Long term (current) use of selective estrogen receptor modulators (SERMs): Secondary | ICD-10-CM | POA: Insufficient documentation

## 2018-05-10 DIAGNOSIS — I10 Essential (primary) hypertension: Secondary | ICD-10-CM

## 2018-05-10 DIAGNOSIS — Z90710 Acquired absence of both cervix and uterus: Secondary | ICD-10-CM | POA: Diagnosis not present

## 2018-05-10 DIAGNOSIS — Z9013 Acquired absence of bilateral breasts and nipples: Secondary | ICD-10-CM | POA: Insufficient documentation

## 2018-05-10 DIAGNOSIS — C50912 Malignant neoplasm of unspecified site of left female breast: Secondary | ICD-10-CM | POA: Insufficient documentation

## 2018-05-10 DIAGNOSIS — Z853 Personal history of malignant neoplasm of breast: Secondary | ICD-10-CM

## 2018-05-10 DIAGNOSIS — Z79899 Other long term (current) drug therapy: Secondary | ICD-10-CM

## 2018-05-10 DIAGNOSIS — G4700 Insomnia, unspecified: Secondary | ICD-10-CM

## 2018-05-10 LAB — CBC WITH DIFFERENTIAL/PLATELET
Basophils Absolute: 0.1 10*3/uL (ref 0–0.1)
Basophils Relative: 1 %
EOS PCT: 0 %
Eosinophils Absolute: 0 10*3/uL (ref 0–0.7)
HEMATOCRIT: 37.3 % (ref 35.0–47.0)
HEMOGLOBIN: 12.2 g/dL (ref 12.0–16.0)
LYMPHS ABS: 3.3 10*3/uL (ref 1.0–3.6)
LYMPHS PCT: 38 %
MCH: 25.9 pg — ABNORMAL LOW (ref 26.0–34.0)
MCHC: 32.8 g/dL (ref 32.0–36.0)
MCV: 79 fL — AB (ref 80.0–100.0)
Monocytes Absolute: 0.6 10*3/uL (ref 0.2–0.9)
Monocytes Relative: 7 %
NEUTROS ABS: 4.8 10*3/uL (ref 1.4–6.5)
Neutrophils Relative %: 54 %
PLATELETS: 271 10*3/uL (ref 150–440)
RBC: 4.72 MIL/uL (ref 3.80–5.20)
RDW: 14.2 % (ref 11.5–14.5)
WBC: 8.9 10*3/uL (ref 3.6–11.0)

## 2018-05-10 LAB — COMPREHENSIVE METABOLIC PANEL
ALBUMIN: 4.4 g/dL (ref 3.5–5.0)
ALT: 19 U/L (ref 0–44)
ANION GAP: 8 (ref 5–15)
AST: 22 U/L (ref 15–41)
Alkaline Phosphatase: 60 U/L (ref 38–126)
BUN: 8 mg/dL (ref 6–20)
CHLORIDE: 107 mmol/L (ref 98–111)
CO2: 24 mmol/L (ref 22–32)
Calcium: 9.2 mg/dL (ref 8.9–10.3)
Creatinine, Ser: 0.69 mg/dL (ref 0.44–1.00)
GFR calc Af Amer: >60 mL/min (ref >60)
GFR calc non Af Amer: >60 mL/min (ref >60)
Glucose, Bld: 93 mg/dL (ref 70–99)
POTASSIUM: 3.7 mmol/L (ref 3.5–5.1)
Sodium: 139 mmol/L (ref 135–145)
Total Bilirubin: 0.6 mg/dL (ref 0.3–1.2)
Total Protein: 7.5 g/dL (ref 6.5–8.1)

## 2018-05-10 NOTE — Progress Notes (Signed)
Patient here for follow up. Complains of being fatigued and having hot flashes. Patient would like for melatonin to be resent to pharmacy since "they never got it the first time."

## 2018-05-11 ENCOUNTER — Ambulatory Visit
Admission: RE | Admit: 2018-05-11 | Discharge: 2018-05-11 | Disposition: A | Discharge status: Home / Self Care | Payer: Managed Care, Other (non HMO) | Source: Ambulatory Visit | Attending: Oncology | Admitting: Oncology

## 2018-05-11 DIAGNOSIS — R05 Cough: Secondary | ICD-10-CM | POA: Diagnosis present

## 2018-05-11 DIAGNOSIS — R059 Cough, unspecified: Secondary | ICD-10-CM

## 2018-05-11 MED ORDER — TAMOXIFEN CITRATE 10 MG PO TABS: 20.0000 mg | ORAL_TABLET | Freq: Every day | ORAL | 5 refills | Status: AC

## 2018-05-11 NOTE — Progress Notes (Signed)
Hematology/Oncology Follow Up Note [facility] Telephone:([phone] [phone]  Patient Care Team: [name], MD as PCP - General (Internal Medicine) [name], MD as Consulting Physician (Oncology) [name], [name], MD (General Surgery)  CHIEF COMPLAINTS/PURPOSE OF CONSULTATION:  I have a history of breast cancer  HISTORY OF PRESENTING ILLNESS:  1 Robin Wilkins 47 y.o.  female with past medical history listed as below, most significant for a history of breast cancer who was referred to Dr. [name] to me for evaluation and management of patient's breast cancer treatment. Patient reports that she moved from [place]. In 2008 may she was diagnosed with left breast cancer. She remembered that she had an excisional biopsy of the breast lesion at first. She was told that it was hormone receptor positive and HER-2 positive. She underwent neoadjuvant chemotherapy July to October 2008. She had bilateral mastectomy on 05/09/2007. She also underwent radiation adjuvantly. She completed 1 year of Herceptin. She was started on tamoxifen in 2009 after she completed all her chemotherapy and radiation. Patient reports that the initial diagnosis, chemotherapy, and surgery were done at [facility]. Later she transferred her care to [facility] and initially she followed up with Dr. [name] once Dr. [name] retired she follows up with Dr. [name]. She has done genetic test, which was negative per patient.  Patient is single, she has 2 adult children, one boy and one girl. She has a history of hysterectomy.  2  04/06/2017 We've received incomplete medical records from [place]. In 2008, he initially presented with bilateral axillary lymph node adenopathy which lead to further workup. We do not have her mammogram and CT report. 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 ER 11% PR 1%, HER-2 overexpression.  Patient underwent PET scan on 02/01/2007 which showed solid hypermetabolic soft tissue mass in the left axilla consistent with metastatic adenopathy measuring 4.9 cm x 2.6 cm no other hypermetabolic foci demonstrated. The patient was started on neoadjuvant chemotherapy with AC/ T regimen. We do not have details about her chemotherapy regimen. Per patient she also completed 1 year of Herceptin. Per Dr. [name], [name]'s note, the patient had great response to the new adjuvant chemotherapy. It was noted that her MRI which was done in 03/14/2007 showed no residual breast or axillary masses. We do not have the full MRI report.  Patient underwent bilateral mastectomy on 06/05/2007.  1 Right mastectomy with attached axillary tail showing focal increased of fibrous stroma, cystic dilatation of the ducts with columnar cell changes, foci of microcalcifications, mild to moderate ductal hyperplasia and the scattered foci of atypical lobular hyperplastic. No in situ carcinoma or invasive carcinoma identified. Skin and nipple of the right breast showing no diagnostic abnormality.  2 Left mastectomy with axillary tail showing organizing fat necrosis, and fibrosis around the previous surgical biopsy site 8 to 9:00 position, retroareolar, with foci of granulomatous changes with presence of many multinucleated giant cells. No residual carcinoma identified. Breast tissue away from the biopsy site showing focal stromal fibrosis, cystic dilatation of ducts with columnar cell changes, mild to moderate ductal hyperplastic, foci of sclerosing adenosis and atypical lobular hyperplastic.   3 level I and the level II left axillary lymph nodes showing metastatic and a moderately differentiated ductal carcinoma involving 4 of the 8  lymph nodes, Matted, group of lymph nodes  measures 3.5 cm in greatest dimension. Focal fibrosis and the calcification noted probably related to the preoperative chemotherapy. Final TNM coating T2, N2a, Mx (the original tumor was excisional biopsied).   Patient was started on tamoxifen 20 mg in September 2009. Patient also had bilateral breast reconstruction. In Dr. [name] note, she had an episode of clotting which threatened to be destroying the entire left breast reconstruction surgery she was treated with Lovenox, and then have evidence of worsening thrombosis and the vascularization, and he walks she was put on aspirin as well and then finally tamoxifen has been temporarily discontinued, which was later restarted. .  #  she was seen by surgery and have chest wall nodule removed which showed lipoma. # She also went for GYN appointment. She was not actually seen by GYN but had estradiol FSH levels checked . She is not in menopause state yet.  INTERVAL HISTORY Patient presents for follow up of management of breast cancer. She takes tamoxifen and has completed 10 years of tamoxifen.  Tolerates very well.  She is very nervous of coming off Tamoxifen.  No new complaints.  Denies any back pain, chest pain. She has intermittent dry cough for a few week. No fever or chills.    ROS:   Review of Systems  Constitutional: Negative for appetite change, chills, fatigue and fever.  HENT:   Negative for hearing loss and lump/mass.   Eyes: Negative for eye problems.  Respiratory: Positive for cough. Negative for chest tightness and shortness of breath.   Cardiovascular: Negative for chest pain and leg swelling.  Gastrointestinal: Negative for abdominal distention.  Endocrine: Negative for hot flashes.  Genitourinary: Negative for bladder incontinence, difficulty urinating, frequency and hematuria.   Musculoskeletal: Negative for arthralgias and flank pain.  Skin: Negative for itching and rash.  Neurological: Negative.  Negative for dizziness.    Hematological: Negative for adenopathy.  Psychiatric/Behavioral: Negative for confusion. The patient is not nervous/anxious.     MEDICAL HISTORY:  Past Medical History:  Diagnosis Date  . Cancer [facility]) 2008   left breast, [facility]  . Genetic screening    negative per pt  . Hypertension     SURGICAL HISTORY: Past Surgical History:  Procedure Laterality Date  . ABDOMINAL HYSTERECTOMY     partial  . BACK SURGERY    . MASTECTOMY Bilateral 2008   chemo and rad  . PLACEMENT OF BREAST IMPLANTS Bilateral 2009   spacers  . PLACEMENT OF BREAST IMPLANTS Right 2009   left breast flap 2010  . TONSILLECTOMY AND ADENOIDECTOMY      SOCIAL HISTORY: Social History   Socioeconomic History  . Marital status: Single    Spouse name: Not on file  . Number of children: Not on file  . Years of education: Not on file  . Highest education level: Not on file  Occupational History  . Not on file  Social Needs  . Financial resource strain: Not on file  . Food insecurity:    Worry: Not on file    Inability: Not on file  . Transportation needs:    Medical: Not on file    Non-medical: Not on file  Tobacco Use  . Smoking status: Never Smoker  . Smokeless tobacco: Never Used  Substance and Sexual Activity  . Alcohol use: No  . Drug use: No  . Sexual activity: Not Currently  Lifestyle  . Physical activity:    Days per  week: Not on file    Minutes per session: Not on file  . Stress: Not on file  Relationships  . Social connections:    Talks on phone: Not on file    Gets together: Not on file    Attends religious service: Not on file    Active member of club or organization: Not on file    Attends meetings of clubs or organizations: Not on file    Relationship status: Not on file  . Intimate partner violence:    Fear of current or ex partner: Not on file    Emotionally abused: Not on file    Physically abused: Not on file    Forced sexual activity: Not on file  Other  Topics Concern  . Not on file  Social History Narrative  . Not on file    FAMILY HISTORY: Family History  Problem Relation Age of Onset  . Kidney cancer Mother   . Diabetes Mother   . Hypertension Mother   . Breast cancer Paternal Aunt        late 59's  . Breast cancer Paternal Grandmother        60's    ALLERGIES:  has No Known Allergies.  MEDICATIONS:  Current Outpatient Medications  Medication Sig Dispense Refill  . [facility] D3 1000 units capsule TAKE 1 CAPSULE BY MOUTH EVERY DAY 120 capsule 0  . cyclobenzaprine (FLEXERIL) 10 MG tablet Take 10 mg by mouth as needed.    . naproxen (EC NAPROSYN) 500 MG EC tablet Take 500 mg by mouth as needed.     . tamoxifen (NOLVADEX) 10 MG tablet Take 2 tablets (20 mg total) by mouth daily. 60 tablet 3  . triamcinolone (KENALOG) 0.025 % cream APPLY TO AFFECTED AREA TWICE A DAY PRN    . losartan-hydrochlorothiazide (HYZAAR) 100-12.5 MG tablet Take by mouth.    . Melatonin 2.5 MG CAPS Take 1 capsule (2.5 mg total) by mouth at bedtime as needed (insomnia). (Patient not taking: Reported on 11/10/2017) 30 capsule 0  . ondansetron (ZOFRAN ODT) 4 MG disintegrating tablet Allow 1-2 tablets to dissolve in your mouth every 8 hours as needed for nausea/vomiting (Patient not taking: Reported on 05/10/2018) 30 tablet 0  . venlafaxine XR (EFFEXOR-XR) 150 MG 24 hr capsule Take 150 mg by mouth daily.     No current facility-administered medications for this visit.       Marland Kitchen  PHYSICAL EXAMINATION: ECOG PERFORMANCE STATUS: 0 - Asymptomatic Vitals:   05/10/18 1430  BP: (!) 139/94  Pulse: 79  Resp: 18  Temp: 99.6 F (37.6 C)   Filed Weights   05/10/18 1430  Weight: 242 lb 3.2 oz (109.9 kg)   Physical Exam  Constitutional: She is oriented to person, place, and time. No distress.  HENT:  Head: Normocephalic and atraumatic.  Mouth/Throat: Oropharynx is clear and moist.  Eyes: Pupils are equal, round, and reactive to light. EOM are normal. No scleral  icterus.  Neck: Normal range of motion. Neck supple.  Cardiovascular: Normal rate, regular rhythm and normal heart sounds.  Pulmonary/Chest: Effort normal. No respiratory distress. She has no wheezes.  Abdominal: Soft. Bowel sounds are normal. She exhibits no distension and no mass. There is no tenderness.  Musculoskeletal: Normal range of motion. She exhibits no edema or deformity.  Neurological: She is alert and oriented to person, place, and time. No cranial nerve deficit. Coordination normal.  Skin: Skin is warm and dry. No rash noted. No erythema.  Psychiatric: She has a normal mood and affect. Her behavior is normal. Thought content normal.  Breast exam is performed in seated and lying down position. Patient is status post bilateral mastectomy with reconstruction. The implant edges are intact. No evidence of bilateral axillary adenopathy.   LABORATORY DATA:  I have reviewed the data as listed Lab Results  Component Value Date   WBC 8.9 05/10/2018   HGB 12.2 05/10/2018   HCT 37.3 05/10/2018   MCV 79.0 (L) 05/10/2018   PLT 271 05/10/2018   Recent Labs    07/28/17 0820 11/10/17 1329 05/10/18 1408  NA 137 138 139  K 3.2* 3.4* 3.7  CL 104 104 107  CO2 _0 GLUCOSE 120* 85 93  BUN _1 CREATININE 0.58 0.72 0.69  CALCIUM 8.6* 9.2 9.2  GFRNONAA >60 >60 >60  GFRAA >60 >60 >60  PROT 6.6 6.9 7.5  ALBUMIN 3.7 4.1 4.4  AST _2 ALT _3 ALKPHOS 67 63 60  BILITOT 0.6 0.8 0.6   Vitamin D 17.4  ASSESSMENT & PLAN:  1. History of breast cancer   2. Cough   3. Long-term current use of tamoxifen   4. Family history of colon cancer   T2, N2a, Mx, ER 11% PR 1% HER2 positive  #Discussed with patient that current standard recommendation is 10 years and she has completed the course of tamoxifen.  Patient expresses her concerns of coming off tamoxifen and requests to stay on tamoxifen.  Discussed with patient about potential complications of prolonged tamoxifen use.   She voiced understanding and prefers to take the risk and continued on tamoxifen for another 6 months.  We will rediscuss at next visit. Continue vitamin D and calcium.  #Dry cough, check x-ray  # Microcytosis, mild. Stable hemoglobin. Iron panel not consistent with iron deficiency  #  family history of Colon cancer and I have previously advised her establish care with GI.  Given her family history of colon cancer and personal history of breast cancer, refer to genetic counseling. All questions were answered. Patient asks for melatonin prescription for insomnia. I advise patient to buy otc melatonin.   The patient knows to call the clinic with any problems questions or concerns.  Return of visit: 6 months w labs. Orders Placed This Encounter  Procedures  . DG Chest 2 View    Standing Status:   Future    Number of Occurrences:   1    Standing Expiration Date:   05/10/2019    Order Specific Question:   Reason for Exam (SYMPTOM  OR DIAGNOSIS REQUIRED)    Answer:   cough    Order Specific Question:   Is patient pregnant?    Answer:   No    Order Specific Question:   Preferred imaging location?    Answer:    [facility]    Order Specific Question:   Radiology Contrast Protocol - do NOT remove file path    Answer:   \\[email or web address]  . Comprehensive metabolic panel    Standing Status:   Future    Standing Expiration Date:   05/11/2019  . CBC with Differential/Platelet    Standing Status:   Future    Standing Expiration Date:   05/11/2019   Total face to face encounter time for this patient visit was 25 min. >50% of the time was  spent in counseling and coordination of care.  [name], MD, PhD Hematology Oncology  [facility] [place]- [phone] 05/11/2018

## 2018-05-14 ENCOUNTER — Encounter: Payer: Self-pay | Admitting: *Deleted

## 2018-08-26 ENCOUNTER — Other Ambulatory Visit: Payer: Self-pay | Admitting: Oncology

## 2018-11-09 ENCOUNTER — Inpatient Hospital Stay: Payer: Self-pay | Admitting: Oncology

## 2018-11-09 ENCOUNTER — Inpatient Hospital Stay: Payer: Self-pay

## 2018-12-05 ENCOUNTER — Telehealth: Payer: Self-pay | Admitting: *Deleted

## 2018-12-05 NOTE — Telephone Encounter (Signed)
Per patient request to [place] 12/07/18 lab/MD appts She stated that she has moved out of town and no longer needs to be seen. Appts were CANCELLED.

## 2018-12-07 ENCOUNTER — Inpatient Hospital Stay: Payer: Self-pay

## 2018-12-07 ENCOUNTER — Inpatient Hospital Stay: Payer: Self-pay | Admitting: Oncology

## 2019-10-30 ENCOUNTER — Other Ambulatory Visit: Payer: Self-pay | Admitting: Neurological Surgery

## 2019-11-29 HISTORY — PX: COLONOSCOPY: SHX174

## 2019-12-02 NOTE — Progress Notes (Signed)
[id number]ina Rabon, [place] [address] [place] [address] Phone: [phone] Fax: [phone]  [id number] Starling Manns, [place] [address] Phone: [phone] Fax: [phone]      Your procedure is scheduled on Friday April 30th.  Report to [facility] Main Entrance "A" at 0530 A.M., and check in at the Admitting office.  Call this number if you have problems the morning of surgery:  [phone]  Call [phone] if you have any questions prior to your surgery date Monday-Friday 8am-4pm    Remember:  Do not eat or drink after midnight the night before your surgery    Take these medicines the morning of surgery with A SIP OF WATER   naproxen (EC NAPROSYN) as needed for pain  ondansetron (ZOFRAN ODT) as needed for nausea  oxyCODONE-acetaminophen (PERCOCET/ROXICET) as needed for pain  As of today, STOP taking any Aspirin (unless otherwise instructed by your surgeon) and Aspirin containing products, Aleve, Naproxen, Ibuprofen, Motrin, Advil, Goody's, BC's, all herbal medications, fish oil, and all vitamins.                      Do not wear jewelry, make up, or nail polish            Do not wear lotions, powders, perfumes or deodorant.            Do not shave 48 hours prior to surgery.              Do not bring valuables to the hospital.            [facility] is not responsible for any belongings or valuables.  Do NOT Smoke (Tobacco/Vapping) or drink Alcohol 24 hours prior to your procedure If you use a CPAP at night, you may bring all equipment for your overnight stay.   Contacts, glasses, dentures or bridgework may not be worn into surgery.      For patients admitted to the hospital, discharge time will be determined by your treatment team.   Patients discharged the day of surgery will not be allowed to drive home, and someone needs to stay with them for 24 hours.  Special  instructions:   [facility]- Preparing For Surgery  Before surgery, you can play an important role. Because skin is not sterile, your skin needs to be as free of germs as possible. You can reduce the number of germs on your skin by washing with CHG (chlorahexidine gluconate) Soap before surgery.  CHG is an antiseptic cleaner which kills germs and bonds with the skin to continue killing germs even after washing.    Oral Hygiene is also important to reduce your risk of infection.  Remember - BRUSH YOUR TEETH THE MORNING OF SURGERY WITH YOUR REGULAR TOOTHPASTE  Please do not use if you have an allergy to CHG or antibacterial soaps. If your skin becomes reddened/irritated stop using the CHG.  Do not shave (including legs and underarms) for at least 48 hours prior to first CHG shower. It is OK to shave your face.  Please follow these instructions carefully.   1. Shower the NIGHT BEFORE SURGERY and the MORNING OF SURGERY with CHG Soap.   2. If you chose to wash your hair, wash your hair first as usual with your normal shampoo.  3. After you shampoo, rinse your hair and body thoroughly to remove the shampoo.  4. Use CHG as  you would any other liquid soap. You can apply CHG directly to the skin and wash gently with a scrungie or a clean washcloth.   5. Apply the CHG Soap to your body ONLY FROM THE NECK DOWN.  Do not use on open wounds or open sores. Avoid contact with your eyes, ears, mouth and genitals (private parts). Wash Face and genitals (private parts)  with your normal soap.   6. Wash thoroughly, paying special attention to the area where your surgery will be performed.  7. Thoroughly rinse your body with warm water from the neck down.  8. DO NOT shower/wash with your normal soap after using and rinsing off the CHG Soap.  9. Pat yourself dry with a CLEAN TOWEL.  10. Wear CLEAN PAJAMAS to bed the night before surgery, wear comfortable clothes the morning of surgery  11. Place CLEAN  SHEETS on your bed the night of your first shower and DO NOT SLEEP WITH PETS.   Day of Surgery:   Do not apply any deodorants/lotions.  Please wear clean clothes to the hospital/surgery center.   Remember to brush your teeth WITH YOUR REGULAR TOOTHPASTE.   Please read over the following fact sheets that you were given.

## 2019-12-03 ENCOUNTER — Other Ambulatory Visit: Payer: Self-pay

## 2019-12-03 ENCOUNTER — Ambulatory Visit (HOSPITAL_COMMUNITY)
Admission: RE | Admit: 2019-12-03 | Discharge: 2019-12-03 | Disposition: A | Discharge status: Home / Self Care | Payer: 59 | Source: Ambulatory Visit | Attending: Neurological Surgery | Admitting: Neurological Surgery

## 2019-12-03 ENCOUNTER — Encounter (HOSPITAL_COMMUNITY): Payer: Self-pay

## 2019-12-03 ENCOUNTER — Other Ambulatory Visit (HOSPITAL_COMMUNITY)
Admission: RE | Admit: 2019-12-03 | Discharge: 2019-12-03 | Disposition: A | Discharge status: Home / Self Care | Payer: 59 | Source: Ambulatory Visit | Attending: Neurological Surgery | Admitting: Neurological Surgery

## 2019-12-03 ENCOUNTER — Encounter (HOSPITAL_COMMUNITY)
Admission: RE | Admit: 2019-12-03 | Discharge: 2019-12-03 | Disposition: A | Discharge status: Home / Self Care | Payer: 59 | Source: Ambulatory Visit | Attending: Neurological Surgery | Admitting: Neurological Surgery

## 2019-12-03 DIAGNOSIS — M48061 Spinal stenosis, lumbar region without neurogenic claudication: Secondary | ICD-10-CM | POA: Diagnosis not present

## 2019-12-03 DIAGNOSIS — Z01818 Encounter for other preprocedural examination: Secondary | ICD-10-CM | POA: Insufficient documentation

## 2019-12-03 DIAGNOSIS — Z20822 Contact with and (suspected) exposure to covid-19: Secondary | ICD-10-CM | POA: Diagnosis not present

## 2019-12-03 HISTORY — DX: Other specified postprocedural states: Z98.890

## 2019-12-03 HISTORY — DX: Anxiety disorder, unspecified: F41.9

## 2019-12-03 HISTORY — DX: Pneumonia, unspecified organism: J18.9

## 2019-12-03 HISTORY — DX: Other complications of anesthesia, initial encounter: T88.59XA

## 2019-12-03 HISTORY — DX: Nausea with vomiting, unspecified: R11.2

## 2019-12-03 HISTORY — DX: Headache, unspecified: R51.9

## 2019-12-03 HISTORY — DX: Anemia, unspecified: D64.9

## 2019-12-03 HISTORY — DX: Fatty (change of) liver, not elsewhere classified: K76.0

## 2019-12-03 HISTORY — DX: Family history of other specified conditions: Z84.89

## 2019-12-03 LAB — CBC WITH DIFFERENTIAL/PLATELET
Abs Immature Granulocytes: 0.01 10*3/uL (ref 0.00–0.07)
Basophils Absolute: 0 10*3/uL (ref 0.0–0.1)
Basophils Relative: 0 %
Eosinophils Absolute: 0.1 10*3/uL (ref 0.0–0.5)
Eosinophils Relative: 1 %
HCT: 41.6 % (ref 36.0–46.0)
Hemoglobin: 12.9 g/dL (ref 12.0–15.0)
Immature Granulocytes: 0 %
Lymphocytes Relative: 35 %
Lymphs Abs: 2.4 10*3/uL (ref 0.7–4.0)
MCH: 25.5 pg — ABNORMAL LOW (ref 26.0–34.0)
MCHC: 31 g/dL (ref 30.0–36.0)
MCV: 82.4 fL (ref 80.0–100.0)
Monocytes Absolute: 0.4 10*3/uL (ref 0.1–1.0)
Monocytes Relative: 6 %
Neutro Abs: 4 10*3/uL (ref 1.7–7.7)
Neutrophils Relative %: 58 %
Platelets: 312 10*3/uL (ref 150–400)
RBC: 5.05 MIL/uL (ref 3.87–5.11)
RDW: 13.2 % (ref 11.5–15.5)
WBC: 6.9 10*3/uL (ref 4.0–10.5)
nRBC: 0 % (ref 0.0–0.2)

## 2019-12-03 LAB — TYPE AND SCREEN
ABO/RH(D): O POS
Antibody Screen: NEGATIVE

## 2019-12-03 LAB — BASIC METABOLIC PANEL
Anion gap: 8 (ref 5–15)
BUN: 5 mg/dL — ABNORMAL LOW (ref 6–20)
CO2: 28 mmol/L (ref 22–32)
Calcium: 9.3 mg/dL (ref 8.9–10.3)
Chloride: 104 mmol/L (ref 98–111)
Creatinine, Ser: 0.72 mg/dL (ref 0.44–1.00)
GFR calc Af Amer: >60 mL/min (ref >60)
GFR calc non Af Amer: >60 mL/min (ref >60)
Glucose, Bld: 103 mg/dL — ABNORMAL HIGH (ref 70–99)
Potassium: 3.8 mmol/L (ref 3.5–5.1)
Sodium: 140 mmol/L (ref 135–145)

## 2019-12-03 LAB — SURGICAL PCR SCREEN
MRSA, PCR: NEGATIVE
Staphylococcus aureus: NEGATIVE

## 2019-12-03 LAB — SARS CORONAVIRUS 2 (TAT 6-24 HRS): SARS Coronavirus 2: NEGATIVE

## 2019-12-03 LAB — ABO/RH: ABO/RH(D): O POS

## 2019-12-03 LAB — PROTIME-INR
INR: 1.1 (ref 0.8–1.2)
Prothrombin Time: 13.3 seconds (ref 11.4–15.2)

## 2019-12-03 NOTE — Progress Notes (Signed)
PCP - Leonie Douglas. [name], MD Cardiologist - Denies  PPM/ICD - Denies  Chest x-ray - 09/13/19 from [facility]; records requested; 12/03/19 EKG - 12/03/19 Stress Test - per patient, > 5 years ago; done to determine reason for elevated HR-- cold medicine turned out to be cause. Done in [place]. Per patient, test results were negative. ECHO - Denies Cardiac Cath - Denies  Sleep Study - Denies  Patient denies being a diabetic.  Blood Thinner Instructions: N/A Aspirin Instructions: N/A  ERAS Protcol - N/A  COVID TEST- 12/03/19 @ [facility]   Anesthesia review: Yes, requested CXR from [facility].  Patient denies shortness of breath, fever, cough and chest pain at PAT appointment   All instructions explained to the patient, with a verbal understanding of the material. Patient agrees to go over the instructions while at home for a better understanding. Patient also instructed to self quarantine after being tested for COVID-19. The opportunity to ask questions was provided.

## 2019-12-03 NOTE — Progress Notes (Signed)
[id number] O[facility] Hollingshead [address] [place] [address] Phone: [phone] Fax: [phone]  [id number] [place], [place] [address] Phone: [phone] Fax: [phone]     Your procedure is scheduled on Friday April 30th, from 07:30 AM- 10:37 AM.  Report to [facility] Main Entrance "A" at 05:30 A.M., and check in at the Admitting office.  Call this number if you have problems the morning of surgery:  [phone]  Call [phone] if you have any questions prior to your surgery date Monday-Friday 8am-4pm.    Remember:  Do not eat or drink after midnight the night before your surgery.    Take these medicines the morning of surgery with A SIP OF WATER:   oxyCODONE-acetaminophen (PERCOCET/ROXICET) as needed for pain  As of today, STOP taking any Aspirin (unless otherwise instructed by your surgeon) and Aspirin containing products, Aleve, Naproxen, Ibuprofen, Motrin, Advil, Goody's, BC's, all herbal medications, fish oil, and all vitamins.                      Do not wear jewelry, make up, or nail polish.            Do not wear lotions, powders, perfumes or deodorant.            Do not shave 48 hours prior to surgery.              Do not bring valuables to the hospital.            [facility] is not responsible for any belongings or valuables.  Do NOT Smoke (Tobacco/Vapping) or drink Alcohol 24 hours prior to your procedure.  If you use a CPAP at night, you may bring all equipment for your overnight stay.   Contacts, glasses, dentures or bridgework may not be worn into surgery.      For patients admitted to the hospital, discharge time will be determined by your treatment team.   Patients discharged the day of surgery will not be allowed to drive home, and someone needs to stay with them for 24 hours.  Special instructions:   [facility]- Preparing For  Surgery  Before surgery, you can play an important role. Because skin is not sterile, your skin needs to be as free of germs as possible. You can reduce the number of germs on your skin by washing with CHG (chlorahexidine gluconate) Soap before surgery.  CHG is an antiseptic cleaner which kills germs and bonds with the skin to continue killing germs even after washing.    Oral Hygiene is also important to reduce your risk of infection.  Remember - BRUSH YOUR TEETH THE MORNING OF SURGERY WITH YOUR REGULAR TOOTHPASTE  Please do not use if you have an allergy to CHG or antibacterial soaps. If your skin becomes reddened/irritated stop using the CHG.  Do not shave (including legs and underarms) for at least 48 hours prior to first CHG shower. It is OK to shave your face.  Please follow these instructions carefully.   1. Shower the NIGHT BEFORE SURGERY and the MORNING OF SURGERY with CHG Soap.   2. If you chose to wash your hair, wash your hair first as usual with your normal shampoo.  3. After you shampoo, rinse your hair and body thoroughly to remove the shampoo.  4. Use CHG as you would any other liquid soap. You can apply CHG directly  to the skin and wash gently with a scrungie or a clean washcloth.   5. Apply the CHG Soap to your body ONLY FROM THE NECK DOWN.  Do not use on open wounds or open sores. Avoid contact with your eyes, ears, mouth and genitals (private parts). Wash Face and genitals (private parts)  with your normal soap.   6. Wash thoroughly, paying special attention to the area where your surgery will be performed.  7. Thoroughly rinse your body with warm water from the neck down.  8. DO NOT shower/wash with your normal soap after using and rinsing off the CHG Soap.  9. Pat yourself dry with a CLEAN TOWEL.  10. Wear CLEAN PAJAMAS to bed the night before surgery, wear comfortable clothes the morning of surgery  11. Place CLEAN SHEETS on your bed the night of your first  shower and DO NOT SLEEP WITH PETS.   Day of Surgery:   Do not apply any deodorants/lotions.  Please wear clean clothes to the hospital/surgery center.   Remember to brush your teeth WITH YOUR REGULAR TOOTHPASTE.   Please read over the following fact sheets that you were given.

## 2019-12-04 NOTE — Anesthesia Preprocedure Evaluation (Addendum)
Anesthesia Evaluation  Patient identified by MRN, date of birth, ID band Patient awake    Reviewed: Allergy & Precautions, NPO status , Patient's Chart, lab work & pertinent test results  History of Anesthesia Complications (+) PONV and history of anesthetic complications  Airway Mallampati: III  TM Distance: >3 FB Neck ROM: Full    Dental  (+) Dental Advisory Given   Pulmonary neg sleep apnea, neg COPD, neg recent URI,    breath sounds clear to auscultation       Cardiovascular hypertension, Pt. on medications (-) angina(-) Past MI  Rhythm:Regular     Neuro/Psych  Headaches, PSYCHIATRIC DISORDERS Anxiety    GI/Hepatic negative GI ROS, Neg liver ROS,   Endo/Other  Morbid obesity  Renal/GU negative Renal ROS     Musculoskeletal   Abdominal   Peds  Hematology  (+) Blood dyscrasia, anemia ,   Anesthesia Other Findings   Reproductive/Obstetrics                             Anesthesia Physical Anesthesia Plan  ASA: III  Anesthesia Plan: General   Post-op Pain Management:    Induction: Intravenous  PONV Risk Score and Plan: 4 or greater and Ondansetron, Dexamethasone and Scopolamine patch - Pre-op  Airway Management Planned: Oral ETT  Additional Equipment: None  Intra-op Plan:   Post-operative Plan: Extubation in OR  Informed Consent: I have reviewed the patients History and Physical, chart, labs and discussed the procedure including the risks, benefits and alternatives for the proposed anesthesia with the patient or authorized representative who has indicated his/her understanding and acceptance.     Dental advisory given  Plan Discussed with: CRNA and Surgeon  Anesthesia Plan Comments: (PAT note written 12/04/2019 by Myra Gianotti, PA-C. )       Anesthesia Quick Evaluation

## 2019-12-04 NOTE — Progress Notes (Addendum)
Anesthesia Chart Review:  Case: [id number] Date/Time: 12/06/19 0715   Procedure: PLIF - L4-L5 (N/A Back)   Anesthesia type: General   Pre-op diagnosis: Stenosis   Location: [facility] OR ROOM 38 / Day OR   Surgeons: Eustace Moore, MD      DISCUSSION: Patient is a 49 year old female scheduled for the above procedure.  History includes never smoker, post-operative N/V, HTN, left breast cancer (2008, s/p bilateral mastectomies, chemotherapy, [facility], [place]; breat implants 2009), fatty liver (s/p liver biopsy 08/20/15 showing "steatosis and no fibrosis"), anemia, migraines, anxiety, hysterectomy, back surgery. Stable small pulmonary nodules on 11/28/19 chest CT done at [facility]. BMI is consistent with obesity.  She denied shortness of breath, cough, fever, chest pain at PAT RN visit. + COVID-19 08/16/19. Presurgical COVID-19 test negative on 12/03/2019.  Anesthesia team to evaluate on the day of surgery.   VS: BP (!) 133/49   Pulse 60   Temp 37.1 C (Oral)   Resp 18   Ht 5\' 8"  (1.727 m)   Wt 116.5 kg   SpO2 100%   BMI 39.06 kg/m    PROVIDERS: [name], MD is PCP Jefm Bryant, [facility] Care Everywhere) Gloriann Loan, MD is HEM-ONC ([place]). Last visit 12/03/19.    LABS: Labs reviewed: Acceptable for surgery. LFTS WNL 09/13/19 (Care Everywhere) (all labs ordered are listed, but only abnormal results are displayed)  Labs Reviewed  BASIC METABOLIC PANEL - Abnormal; Notable for the following components:      Result Value   Glucose, Bld 103 (*)    BUN 5 (*)    All other components within normal limits  CBC WITH DIFFERENTIAL/PLATELET - Abnormal; Notable for the following components:   MCH 25.5 (*)    All other components within normal limits  SURGICAL PCR SCREEN  PROTIME-INR  TYPE AND SCREEN  ABO/RH    IMAGES: CXR 12/03/19: FINDINGS: - Lung volumes and mediastinal contours remain normal. Visualized tracheal air column is within normal limits. Both lungs appear stable and clear. No  pneumothorax or pleural effusion. - Extensive left chest wall/axillary surgical clips are stable. No acute osseous abnormality identified. Negative visible bowel gas pattern. IMPRESSION: No acute cardiopulmonary abnormality.  CT Chest 11/28/19 [facility]): Result Impression: 1. There is no change in the size and number of the small pulmonary nodules relative to the comparison CT imaging from November 20, 2018. 2. No findings of infectious pneumonia or other acute pulmonary process.   EKG: EKG 12/03/19: Sinus bradycardia 58 bpm   CV: She reported a negative stress test > 5 years ago in [place] for evaluation of tachycardia. Reportedly tachycardia determined to be due to cold medication. (She denied an echo, but oncology notes mention completing a year of Herceptin ~ 2008-2009, so likely had study to evaluate EF at that time. No records currently available.)   Past Medical History:  Diagnosis Date  . Anemia   . Anxiety    hx of anxiety, after loss of mother  . Cancer [facility]) 2008   left breast, [facility]  . Complication of anesthesia   . Family history of adverse reaction to anesthesia    mother had difficulty intubating  . Fatty liver   . Genetic screening    negative per pt  . Headache    migraines  . Hypertension   . Pneumonia   . PONV (postoperative nausea and vomiting)     Past Surgical History:  Procedure Laterality Date  . ABDOMINAL HYSTERECTOMY  partial  . BACK SURGERY    . BREAST SURGERY    . COLONOSCOPY  11/29/2019  . DILATION AND CURETTAGE OF UTERUS     at ~49 years old  . MASTECTOMY Bilateral 2008   chemo and rad  . PLACEMENT OF BREAST IMPLANTS Bilateral 2009   spacers  . PLACEMENT OF BREAST IMPLANTS Right 2009   left breast flap 2010  . TONSILLECTOMY AND ADENOIDECTOMY      MEDICATIONS: . bisoprolol-hydrochlorothiazide (ZIAC) 10-6.25 MG tablet  . calcium carbonate (TUMS - DOSED IN MG ELEMENTAL CALCIUM) 500 MG chewable tablet  .  Cholecalciferol (VITAMIN D) 125 MCG (5000 UT) CAPS  . CVS D3 25 MCG (1000 UT) capsule  . Melatonin 2.5 MG CAPS  . naproxen (EC NAPROSYN) 500 MG EC tablet  . ondansetron (ZOFRAN ODT) 4 MG disintegrating tablet  . oxyCODONE-acetaminophen (PERCOCET/ROXICET) 5-325 MG tablet  . tamoxifen (NOLVADEX) 10 MG tablet   No current facility-administered medications for this encounter.    Myra Gianotti, PA-C Surgical Short Stay/Anesthesiology [facility] Phone [phone] [facility] Phone [phone] 12/04/2019 12:18 PM

## 2019-12-06 ENCOUNTER — Ambulatory Visit (HOSPITAL_COMMUNITY): Admitting: Vascular Surgery

## 2019-12-06 ENCOUNTER — Ambulatory Visit (HOSPITAL_COMMUNITY): Admitting: Anesthesiology

## 2019-12-06 ENCOUNTER — Other Ambulatory Visit: Payer: Self-pay

## 2019-12-06 ENCOUNTER — Ambulatory Visit (HOSPITAL_COMMUNITY)

## 2019-12-06 ENCOUNTER — Encounter (HOSPITAL_COMMUNITY)
Admission: RE | Disposition: A | Discharge status: Home / Self Care | Payer: Self-pay | Source: Home / Self Care | Attending: Neurological Surgery

## 2019-12-06 ENCOUNTER — Encounter (HOSPITAL_COMMUNITY): Payer: Self-pay | Admitting: Neurological Surgery

## 2019-12-06 ENCOUNTER — Ambulatory Visit (HOSPITAL_COMMUNITY)
Admission: RE | Admit: 2019-12-06 | Discharge: 2019-12-07 | Disposition: A | Discharge status: Home / Self Care | Attending: Neurological Surgery | Admitting: Neurological Surgery

## 2019-12-06 DIAGNOSIS — Z803 Family history of malignant neoplasm of breast: Secondary | ICD-10-CM | POA: Insufficient documentation

## 2019-12-06 DIAGNOSIS — I10 Essential (primary) hypertension: Secondary | ICD-10-CM | POA: Insufficient documentation

## 2019-12-06 DIAGNOSIS — K760 Fatty (change of) liver, not elsewhere classified: Secondary | ICD-10-CM | POA: Diagnosis not present

## 2019-12-06 DIAGNOSIS — M4316 Spondylolisthesis, lumbar region: Secondary | ICD-10-CM | POA: Diagnosis not present

## 2019-12-06 DIAGNOSIS — Z79899 Other long term (current) drug therapy: Secondary | ICD-10-CM | POA: Diagnosis not present

## 2019-12-06 DIAGNOSIS — Z419 Encounter for procedure for purposes other than remedying health state, unspecified: Secondary | ICD-10-CM

## 2019-12-06 DIAGNOSIS — Z981 Arthrodesis status: Secondary | ICD-10-CM

## 2019-12-06 DIAGNOSIS — Z6838 Body mass index (BMI) 38.0-38.9, adult: Secondary | ICD-10-CM | POA: Insufficient documentation

## 2019-12-06 DIAGNOSIS — F419 Anxiety disorder, unspecified: Secondary | ICD-10-CM | POA: Diagnosis not present

## 2019-12-06 DIAGNOSIS — Z853 Personal history of malignant neoplasm of breast: Secondary | ICD-10-CM | POA: Insufficient documentation

## 2019-12-06 DIAGNOSIS — M48061 Spinal stenosis, lumbar region without neurogenic claudication: Secondary | ICD-10-CM | POA: Insufficient documentation

## 2019-12-06 SURGERY — POSTERIOR LUMBAR FUSION 1 LEVEL
Anesthesia: General | Site: Spine Lumbar

## 2019-12-06 MED ORDER — HYDROMORPHONE HCL 1 MG/ML IJ SOLN
0.5000 mg | INTRAMUSCULAR | Status: DC | PRN
  Administered 2019-12-06 (×2): 0.5 mg via INTRAVENOUS
  Filled 2019-12-06 (×2): qty 0.5

## 2019-12-06 MED ORDER — PHENOL 1.4 % MT LIQD: 1.0000 | OROMUCOSAL | Status: DC | PRN

## 2019-12-06 MED ORDER — ACETAMINOPHEN 10 MG/ML IV SOLN
1000.0000 mg | Freq: Once | INTRAVENOUS | Status: AC
  Administered 2019-12-06: 10:00:00 1000 mg via INTRAVENOUS
  Filled 2019-12-06: qty 100

## 2019-12-06 MED ORDER — SCOPOLAMINE 1 MG/3DAYS TD PT72
MEDICATED_PATCH | TRANSDERMAL | Status: AC
  Filled 2019-12-06: qty 1

## 2019-12-06 MED ORDER — CELECOXIB 200 MG PO CAPS
200.0000 mg | ORAL_CAPSULE | Freq: Two times a day (BID) | ORAL | Status: DC
  Administered 2019-12-06 (×2): 200 mg via ORAL
  Filled 2019-12-06 (×2): qty 1

## 2019-12-06 MED ORDER — 0.9 % SODIUM CHLORIDE (POUR BTL) OPTIME
TOPICAL | Status: DC | PRN
  Administered 2019-12-06 (×2): 1000 mL

## 2019-12-06 MED ORDER — METHOCARBAMOL 500 MG PO TABS
ORAL_TABLET | ORAL | Status: AC
  Administered 2019-12-06: 500 mg via ORAL
  Filled 2019-12-06: qty 1

## 2019-12-06 MED ORDER — THROMBIN 5000 UNITS EX SOLR
OROMUCOSAL | Status: DC | PRN
  Administered 2019-12-06: 5 mL via TOPICAL

## 2019-12-06 MED ORDER — SUGAMMADEX SODIUM 200 MG/2ML IV SOLN
INTRAVENOUS | Status: DC | PRN
Start: 2019-12-06 — End: 2019-12-06
  Administered 2019-12-06: 200 mg via INTRAVENOUS

## 2019-12-06 MED ORDER — ACETAMINOPHEN 10 MG/ML IV SOLN: 1000.0000 mg | Freq: Once | INTRAVENOUS | Status: DC | PRN

## 2019-12-06 MED ORDER — ACETAMINOPHEN 160 MG/5ML PO SOLN: 1000.0000 mg | Freq: Once | ORAL | Status: DC | PRN

## 2019-12-06 MED ORDER — THROMBIN 20000 UNITS EX SOLR
CUTANEOUS | Status: AC
  Filled 2019-12-06: qty 20000

## 2019-12-06 MED ORDER — SENNA 8.6 MG PO TABS
1.0000 | ORAL_TABLET | Freq: Two times a day (BID) | ORAL | Status: DC
  Administered 2019-12-06: 8.6 mg via ORAL
  Filled 2019-12-06: qty 1

## 2019-12-06 MED ORDER — FENTANYL CITRATE (PF) 250 MCG/5ML IJ SOLN
INTRAMUSCULAR | Status: AC
  Filled 2019-12-06: qty 5

## 2019-12-06 MED ORDER — ROCURONIUM BROMIDE 10 MG/ML (PF) SYRINGE
PREFILLED_SYRINGE | INTRAVENOUS | Status: AC
  Filled 2019-12-06: qty 10

## 2019-12-06 MED ORDER — BUPIVACAINE HCL (PF) 0.25 % IJ SOLN
INTRAMUSCULAR | Status: DC | PRN
  Administered 2019-12-06: 7 mL

## 2019-12-06 MED ORDER — OXYCODONE HCL 5 MG PO TABS: 5.0000 mg | ORAL_TABLET | Freq: Once | ORAL | Status: AC | PRN

## 2019-12-06 MED ORDER — LACTATED RINGERS IV SOLN: INTRAVENOUS | Status: DC | PRN

## 2019-12-06 MED ORDER — ACETAMINOPHEN 10 MG/ML IV SOLN
INTRAVENOUS | Status: AC
  Filled 2019-12-06: qty 100

## 2019-12-06 MED ORDER — CEFAZOLIN SODIUM-DEXTROSE 2-4 GM/100ML-% IV SOLN
2.0000 g | INTRAVENOUS | Status: AC
  Administered 2019-12-06: 08:00:00 2 g via INTRAVENOUS
  Filled 2019-12-06: qty 100

## 2019-12-06 MED ORDER — MIDAZOLAM HCL 5 MG/5ML IJ SOLN
INTRAMUSCULAR | Status: DC | PRN
  Administered 2019-12-06: 2 mg via INTRAVENOUS

## 2019-12-06 MED ORDER — FENTANYL CITRATE (PF) 100 MCG/2ML IJ SOLN
INTRAMUSCULAR | Status: DC | PRN
  Administered 2019-12-06: 150 ug via INTRAVENOUS
  Administered 2019-12-06 (×2): 50 ug via INTRAVENOUS

## 2019-12-06 MED ORDER — KETOROLAC TROMETHAMINE 30 MG/ML IJ SOLN
30.0000 mg | Freq: Once | INTRAMUSCULAR | Status: AC
  Administered 2019-12-06: 10:00:00 30 mg via INTRAVENOUS
  Filled 2019-12-06: qty 1

## 2019-12-06 MED ORDER — SODIUM CHLORIDE 0.9% FLUSH: 3.0000 mL | INTRAVENOUS | Status: DC | PRN

## 2019-12-06 MED ORDER — BUPIVACAINE HCL (PF) 0.25 % IJ SOLN
INTRAMUSCULAR | Status: AC
  Filled 2019-12-06: qty 30

## 2019-12-06 MED ORDER — DEXAMETHASONE SODIUM PHOSPHATE 10 MG/ML IJ SOLN
10.0000 mg | Freq: Once | INTRAMUSCULAR | Status: AC
  Administered 2019-12-06: 08:00:00 10 mg via INTRAVENOUS
  Filled 2019-12-06: qty 1

## 2019-12-06 MED ORDER — POTASSIUM CHLORIDE IN NACL 20-0.9 MEQ/L-% IV SOLN: INTRAVENOUS | Status: DC

## 2019-12-06 MED ORDER — OXYCODONE HCL 5 MG PO TABS
10.0000 mg | ORAL_TABLET | ORAL | Status: DC | PRN
  Administered 2019-12-06 – 2019-12-07 (×4): 10 mg via ORAL
  Filled 2019-12-06 (×4): qty 2

## 2019-12-06 MED ORDER — ROCURONIUM BROMIDE 10 MG/ML (PF) SYRINGE
PREFILLED_SYRINGE | INTRAVENOUS | Status: DC | PRN
  Administered 2019-12-06: 70 mg via INTRAVENOUS
  Administered 2019-12-06: 30 mg via INTRAVENOUS

## 2019-12-06 MED ORDER — ONDANSETRON HCL 4 MG/2ML IJ SOLN
INTRAMUSCULAR | Status: AC
  Filled 2019-12-06: qty 2

## 2019-12-06 MED ORDER — THROMBIN 5000 UNITS EX SOLR
CUTANEOUS | Status: AC
  Filled 2019-12-06: qty 5000

## 2019-12-06 MED ORDER — ACETAMINOPHEN 500 MG PO TABS: 1000.0000 mg | ORAL_TABLET | Freq: Once | ORAL | Status: DC | PRN

## 2019-12-06 MED ORDER — PROPOFOL 10 MG/ML IV BOLUS
INTRAVENOUS | Status: AC
  Filled 2019-12-06: qty 20

## 2019-12-06 MED ORDER — OXYCODONE HCL 5 MG PO TABS
ORAL_TABLET | ORAL | Status: AC
  Administered 2019-12-06: 5 mg via ORAL
  Filled 2019-12-06: qty 1

## 2019-12-06 MED ORDER — SODIUM CHLORIDE 0.9% FLUSH
3.0000 mL | Freq: Two times a day (BID) | INTRAVENOUS | Status: DC
  Administered 2019-12-06: 3 mL via INTRAVENOUS

## 2019-12-06 MED ORDER — METHOCARBAMOL 1000 MG/10ML IJ SOLN
500.0000 mg | Freq: Four times a day (QID) | INTRAVENOUS | Status: DC | PRN
  Filled 2019-12-06: qty 5

## 2019-12-06 MED ORDER — METHOCARBAMOL 500 MG PO TABS
500.0000 mg | ORAL_TABLET | Freq: Four times a day (QID) | ORAL | Status: DC | PRN
  Administered 2019-12-06 – 2019-12-07 (×4): 500 mg via ORAL
  Filled 2019-12-06 (×4): qty 1

## 2019-12-06 MED ORDER — PROPOFOL 10 MG/ML IV BOLUS
INTRAVENOUS | Status: DC | PRN
  Administered 2019-12-06: 110 mg via INTRAVENOUS

## 2019-12-06 MED ORDER — KETOROLAC TROMETHAMINE 30 MG/ML IJ SOLN
INTRAMUSCULAR | Status: AC
  Filled 2019-12-06: qty 1

## 2019-12-06 MED ORDER — BISOPROLOL-HYDROCHLOROTHIAZIDE 10-6.25 MG PO TABS
1.0000 | ORAL_TABLET | Freq: Every day | ORAL | Status: DC
  Administered 2019-12-06: 1 via ORAL
  Filled 2019-12-06 (×2): qty 1

## 2019-12-06 MED ORDER — LIDOCAINE 2% (20 MG/ML) 5 ML SYRINGE
INTRAMUSCULAR | Status: DC | PRN
  Administered 2019-12-06: 60 mg via INTRAVENOUS

## 2019-12-06 MED ORDER — ACETAMINOPHEN 325 MG PO TABS: 650.0000 mg | ORAL_TABLET | ORAL | Status: DC | PRN

## 2019-12-06 MED ORDER — MENTHOL 3 MG MT LOZG: 1.0000 | LOZENGE | OROMUCOSAL | Status: DC | PRN

## 2019-12-06 MED ORDER — ACETAMINOPHEN 650 MG RE SUPP: 650.0000 mg | RECTAL | Status: DC | PRN

## 2019-12-06 MED ORDER — SODIUM CHLORIDE 0.9 % IV SOLN
INTRAVENOUS | Status: DC | PRN
  Administered 2019-12-06: 500 mL

## 2019-12-06 MED ORDER — MIDAZOLAM HCL 2 MG/2ML IJ SOLN
INTRAMUSCULAR | Status: AC
  Filled 2019-12-06: qty 2

## 2019-12-06 MED ORDER — DEXAMETHASONE SODIUM PHOSPHATE 10 MG/ML IJ SOLN
INTRAMUSCULAR | Status: AC
  Filled 2019-12-06: qty 1

## 2019-12-06 MED ORDER — SCOPOLAMINE 1 MG/3DAYS TD PT72
MEDICATED_PATCH | TRANSDERMAL | Status: DC | PRN
  Administered 2019-12-06: 1 via TRANSDERMAL

## 2019-12-06 MED ORDER — SODIUM CHLORIDE 0.9 % IV SOLN: 250.0000 mL | INTRAVENOUS | Status: DC

## 2019-12-06 MED ORDER — DEXAMETHASONE SODIUM PHOSPHATE 4 MG/ML IJ SOLN
4.0000 mg | Freq: Four times a day (QID) | INTRAMUSCULAR | Status: DC
  Administered 2019-12-06 – 2019-12-07 (×3): 4 mg via INTRAVENOUS
  Filled 2019-12-06 (×3): qty 1

## 2019-12-06 MED ORDER — CHLORHEXIDINE GLUCONATE CLOTH 2 % EX PADS: 6.0000 | MEDICATED_PAD | Freq: Once | CUTANEOUS | Status: DC

## 2019-12-06 MED ORDER — THROMBIN 20000 UNITS EX SOLR
CUTANEOUS | Status: DC | PRN
  Administered 2019-12-06: 09:00:00 20 mL via TOPICAL

## 2019-12-06 MED ORDER — LIDOCAINE 2% (20 MG/ML) 5 ML SYRINGE
INTRAMUSCULAR | Status: AC
  Filled 2019-12-06: qty 5

## 2019-12-06 MED ORDER — FENTANYL CITRATE (PF) 100 MCG/2ML IJ SOLN
INTRAMUSCULAR | Status: AC
  Filled 2019-12-06: qty 2

## 2019-12-06 MED ORDER — DEXAMETHASONE 4 MG PO TABS
4.0000 mg | ORAL_TABLET | Freq: Four times a day (QID) | ORAL | Status: DC
  Administered 2019-12-06: 4 mg via ORAL
  Filled 2019-12-06: qty 1

## 2019-12-06 MED ORDER — CEFAZOLIN SODIUM-DEXTROSE 2-4 GM/100ML-% IV SOLN
2.0000 g | Freq: Three times a day (TID) | INTRAVENOUS | Status: AC
  Administered 2019-12-06 (×2): 2 g via INTRAVENOUS
  Filled 2019-12-06 (×2): qty 100

## 2019-12-06 MED ORDER — FENTANYL CITRATE (PF) 100 MCG/2ML IJ SOLN
25.0000 ug | INTRAMUSCULAR | Status: DC | PRN
  Administered 2019-12-06 (×2): 25 ug via INTRAVENOUS

## 2019-12-06 MED ORDER — ONDANSETRON HCL 4 MG/2ML IJ SOLN: 4.0000 mg | Freq: Four times a day (QID) | INTRAMUSCULAR | Status: DC | PRN

## 2019-12-06 MED ORDER — ONDANSETRON HCL 4 MG PO TABS: 4.0000 mg | ORAL_TABLET | Freq: Four times a day (QID) | ORAL | Status: DC | PRN

## 2019-12-06 MED ORDER — OXYCODONE HCL 5 MG/5ML PO SOLN: 5.0000 mg | Freq: Once | ORAL | Status: AC | PRN

## 2019-12-06 SURGICAL SUPPLY — 70 items
BAG DECANTER FOR FLEXI CONT (MISCELLANEOUS) ×3 IMPLANT
BASKET BONE COLLECTION (BASKET) ×3 IMPLANT
BENZOIN TINCTURE PRP APPL 2/3 (GAUZE/BANDAGES/DRESSINGS) ×3 IMPLANT
BLADE CLIPPER SURG (BLADE) IMPLANT
BUR CARBIDE MATCH 3.0 (BURR) ×3 IMPLANT
CANISTER SUCT 3000ML PPV (MISCELLANEOUS) ×3 IMPLANT
CARTRIDGE OIL MAESTRO DRILL (MISCELLANEOUS) IMPLANT
CLOSURE WOUND 1/2 X4 (GAUZE/BANDAGES/DRESSINGS) ×2
CNTNR URN SCR LID CUP LEK RST (MISCELLANEOUS) ×1 IMPLANT
CONT SPEC 4OZ STRL OR WHT (MISCELLANEOUS) ×2
COVER BACK TABLE 60X90IN (DRAPES) ×3 IMPLANT
COVER WAND RF STERILE (DRAPES) IMPLANT
DERMABOND ADVANCED (GAUZE/BANDAGES/DRESSINGS) ×2
DERMABOND ADVANCED .7 DNX12 (GAUZE/BANDAGES/DRESSINGS) ×1 IMPLANT
DIFFUSER DRILL AIR PNEUMATIC (MISCELLANEOUS) IMPLANT
DRAPE C-ARM 42X72 X-RAY (DRAPES) ×3 IMPLANT
DRAPE C-ARMOR (DRAPES) ×3 IMPLANT
DRAPE LAPAROTOMY 100X72X124 (DRAPES) ×3 IMPLANT
DRAPE SURG 17X23 STRL (DRAPES) ×3 IMPLANT
DRSG OPSITE POSTOP 4X6 (GAUZE/BANDAGES/DRESSINGS) ×3 IMPLANT
DURAPREP 26ML APPLICATOR (WOUND CARE) ×3 IMPLANT
ELECT REM PT RETURN 9FT ADLT (ELECTROSURGICAL) ×15
ELECTRODE REM PT RTRN 9FT ADLT (ELECTROSURGICAL) ×5 IMPLANT
EVACUATOR 1/8 PVC DRAIN (DRAIN) IMPLANT
GAUZE 4X4 16PLY RFD (DISPOSABLE) IMPLANT
GLOVE BIO SURGEON STRL SZ 6.5 (GLOVE) ×8 IMPLANT
GLOVE BIO SURGEON STRL SZ7 (GLOVE) ×3 IMPLANT
GLOVE BIO SURGEON STRL SZ7.5 (GLOVE) ×3 IMPLANT
GLOVE BIO SURGEON STRL SZ8 (GLOVE) ×3 IMPLANT
GLOVE BIO SURGEONS STRL SZ 6.5 (GLOVE) ×4
GLOVE BIOGEL PI IND STRL 6.5 (GLOVE) ×4 IMPLANT
GLOVE BIOGEL PI IND STRL 7.0 (GLOVE) ×3 IMPLANT
GLOVE BIOGEL PI IND STRL 7.5 (GLOVE) ×1 IMPLANT
GLOVE BIOGEL PI INDICATOR 6.5 (GLOVE) ×8
GLOVE BIOGEL PI INDICATOR 7.0 (GLOVE) ×6
GLOVE BIOGEL PI INDICATOR 7.5 (GLOVE) ×2
GLOVE ECLIPSE 6.5 STRL STRAW (GLOVE) ×3 IMPLANT
GOWN STRL REUS W/ TWL LRG LVL3 (GOWN DISPOSABLE) ×6 IMPLANT
GOWN STRL REUS W/ TWL XL LVL3 (GOWN DISPOSABLE) ×1 IMPLANT
GOWN STRL REUS W/TWL 2XL LVL3 (GOWN DISPOSABLE) IMPLANT
GOWN STRL REUS W/TWL LRG LVL3 (GOWN DISPOSABLE) ×12
GOWN STRL REUS W/TWL XL LVL3 (GOWN DISPOSABLE) ×2
GRAFT TRIN ELITE MED MUSC TRAN (Graft) ×3 IMPLANT
HEMOSTAT POWDER KIT SURGIFOAM (HEMOSTASIS) IMPLANT
KIT BASIN OR (CUSTOM PROCEDURE TRAY) ×3 IMPLANT
KIT BONE MRW ASP ANGEL CPRP (KITS) IMPLANT
KIT TURNOVER KIT B (KITS) ×3 IMPLANT
MILL MEDIUM DISP (BLADE) IMPLANT
NEEDLE HYPO 25X1 1.5 SAFETY (NEEDLE) ×3 IMPLANT
NS IRRIG 1000ML POUR BTL (IV SOLUTION) ×3 IMPLANT
OIL CARTRIDGE MAESTRO DRILL (MISCELLANEOUS)
PACK LAMINECTOMY NEURO (CUSTOM PROCEDURE TRAY) ×3 IMPLANT
PAD ARMBOARD 7.5X6 YLW CONV (MISCELLANEOUS) ×9 IMPLANT
ROD LORD LIPPED TI 5.5X40 (Rod) ×6 IMPLANT
SCREW CORT SHANK MOD 6.5X40 (Screw) ×12 IMPLANT
SCREW POLYAXIAL TULIP (Screw) ×12 IMPLANT
SET SCREW (Screw) ×8 IMPLANT
SET SCREW SPNE (Screw) ×4 IMPLANT
SPACER IDENTITI PS 11X9X25 10D (Spacer) ×6 IMPLANT
SPONGE LAP 4X18 RFD (DISPOSABLE) IMPLANT
SPONGE SURGIFOAM ABS GEL 100 (HEMOSTASIS) ×3 IMPLANT
STRIP CLOSURE SKIN 1/2X4 (GAUZE/BANDAGES/DRESSINGS) ×4 IMPLANT
SUT VIC AB 0 CT1 18XCR BRD8 (SUTURE) ×1 IMPLANT
SUT VIC AB 0 CT1 8-18 (SUTURE) ×2
SUT VIC AB 2-0 CP2 18 (SUTURE) ×3 IMPLANT
SUT VIC AB 3-0 SH 8-18 (SUTURE) ×6 IMPLANT
TOWEL GREEN STERILE (TOWEL DISPOSABLE) ×3 IMPLANT
TOWEL GREEN STERILE FF (TOWEL DISPOSABLE) ×3 IMPLANT
TRAY FOLEY MTR SLVR 16FR STAT (SET/KITS/TRAYS/PACK) ×3 IMPLANT
WATER STERILE IRR 1000ML POUR (IV SOLUTION) ×3 IMPLANT

## 2019-12-06 NOTE — H&P (Signed)
Subjective: Patient is a 49 y.o. female admitted for spondylolisthesis L4-5. Onset of symptoms was several months ago, gradually worsening since that time.  The pain is rated severe, and is located at the across the lower back and radiates to legs R > L. The pain is described as aching and occurs all day. The symptoms have been progressive. Symptoms are exacerbated by exercise. MRI or CT showed recurrent hnp with post-laminectomy spondylolisthesis   Past Medical History:  Diagnosis Date  . Anemia   . Anxiety    hx of anxiety, after loss of mother  . Cancer [facility]) 2008   left breast, [facility]  . Complication of anesthesia   . Family history of adverse reaction to anesthesia    mother had difficulty intubating  . Fatty liver   . Genetic screening    negative per pt  . Headache    migraines  . Hypertension   . Pneumonia   . PONV (postoperative nausea and vomiting)     Past Surgical History:  Procedure Laterality Date  . ABDOMINAL HYSTERECTOMY     partial  . BACK SURGERY    . BREAST SURGERY    . COLONOSCOPY  11/29/2019  . DILATION AND CURETTAGE OF UTERUS     at ~49 years old  . MASTECTOMY Bilateral 2008   chemo and rad  . PLACEMENT OF BREAST IMPLANTS Bilateral 2009   spacers  . PLACEMENT OF BREAST IMPLANTS Right 2009   left breast flap 2010  . TONSILLECTOMY AND ADENOIDECTOMY      Prior to Admission medications   Medication Sig Start Date End Date Taking? Authorizing Provider  bisoprolol-hydrochlorothiazide (ZIAC) 10-6.25 MG tablet Take 1 tablet by mouth daily. 11/20/19  Yes [provider]  calcium carbonate (TUMS - DOSED IN MG ELEMENTAL CALCIUM) 500 MG chewable tablet Chew 1 tablet by mouth daily as needed for indigestion or heartburn.   Yes [provider]  Cholecalciferol (VITAMIN D) 125 MCG (5000 UT) CAPS Take 5,000 Units by mouth daily.   Yes [provider]  oxyCODONE-acetaminophen (PERCOCET/ROXICET) 5-325 MG tablet Take 1 tablet by  mouth 3 (three) times daily as needed for pain. 10/31/19  Yes [provider]  CVS D3 25 MCG (1000 UT) capsule TAKE 1 CAPSULE BY MOUTH EVERY DAY Patient not taking: Reported on 11/21/2019 08/26/18   [name], MD  Melatonin 2.5 MG CAPS Take 1 capsule (2.5 mg total) by mouth at bedtime as needed (insomnia). Patient not taking: Reported on 11/10/2017 07/28/17   [name], MD  naproxen (EC NAPROSYN) 500 MG EC tablet Take 500 mg by mouth 2 (two) times daily as needed (pain).  03/07/16   [provider]  ondansetron (ZOFRAN ODT) 4 MG disintegrating tablet Allow 1-2 tablets to dissolve in your mouth every 8 hours as needed for nausea/vomiting Patient not taking: Reported on 05/10/2018 03/16/16   Hinda Kehr, MD  tamoxifen (NOLVADEX) 10 MG tablet Take 2 tablets (20 mg total) by mouth daily. Patient not taking: Reported on 11/21/2019 05/11/18   [name], MD   No Known Allergies  Social History   Tobacco Use  . Smoking status: Never Smoker  . Smokeless tobacco: Never Used  Substance Use Topics  . Alcohol use: No    Family History  Problem Relation Age of Onset  . Kidney cancer Mother   . Diabetes Mother   . Hypertension Mother   . Breast cancer Paternal Aunt        late 6's  .  Breast cancer Paternal Grandmother        26's     Review of Systems  Positive ROS: neg  All other systems have been reviewed and were otherwise negative with the exception of those mentioned in the HPI and as above.  Objective: Vital signs in last 24 hours: Temp:  [98.3 F (36.8 C)] 98.3 F (36.8 C) (04/30 [id number]) Pulse Rate:  [60] 60 (04/30 0638) Resp:  [17] 17 (04/30 [id number]) BP: (157)/(87) 157/87 (04/30 [id number]) SpO2:  [100 %] 100 % (04/30 [id number]) Weight:  [116.1 kg-116.5 kg] 116.1 kg (04/30 [id number])  General Appearance: Alert, cooperative, no distress, appears stated age Head: Normocephalic, without obvious abnormality, atraumatic Eyes: PERRL, conjunctiva/corneas clear, EOM's intact    Neck: Supple,  symmetrical, trachea midline Back: Symmetric, no curvature, ROM normal, no CVA tenderness Lungs:  respirations unlabored Heart: Regular rate and rhythm Abdomen: Soft, non-tender Extremities: Extremities normal, atraumatic, no cyanosis or edema Pulses: 2+ and symmetric all extremities Skin: Skin color, texture, turgor normal, no rashes or lesions  NEUROLOGIC:   Mental status: Alert and oriented x4,  no aphasia, good attention span, fund of knowledge, and memory Motor Exam - grossly normal Sensory Exam - grossly normal Reflexes: 1+ Coordination - grossly normal Gait - grossly normal Balance - grossly normal Cranial Nerves: I: smell Not tested  II: visual acuity  OS: nl    OD: nl  II: visual fields Full to confrontation  II: pupils Equal, round, reactive to light  III,VII: ptosis None  III,IV,VI: extraocular muscles  Full ROM  V: mastication Normal  V: facial light touch sensation  Normal  V,VII: corneal reflex  Present  VII: facial muscle function - upper  Normal  VII: facial muscle function - lower Normal  VIII: hearing Not tested  IX: soft palate elevation  Normal  IX,X: gag reflex Present  XI: trapezius strength  5/5  XI: sternocleidomastoid strength 5/5  XI: neck flexion strength  5/5  XII: tongue strength  Normal    Data Review Lab Results  Component Value Date   WBC 6.9 12/03/2019   HGB 12.9 12/03/2019   HCT 41.6 12/03/2019   MCV 82.4 12/03/2019   PLT 312 12/03/2019   Lab Results  Component Value Date   NA 140 12/03/2019   K 3.8 12/03/2019   CL 104 12/03/2019   CO2 28 12/03/2019   BUN 5 (L) 12/03/2019   CREATININE 0.72 12/03/2019   GLUCOSE 103 (H) 12/03/2019   Lab Results  Component Value Date   INR 1.1 12/03/2019    Assessment/Plan:  Estimated body mass index is 38.92 kg/m as calculated from the following:   Height as of this encounter: 5\' 8"  (1.727 m).   Weight as of this encounter: 116.1 kg. Patient admitted for PLIF L4-5. Patient has failed  a reasonable attempt at conservative therapy.  I explained the condition and procedure to the patient and answered any questions.  Patient wishes to proceed with procedure as planned. Understands risks/ benefits and typical outcomes of procedure.   Eustace Moore 12/06/2019 7:12 AM

## 2019-12-06 NOTE — Anesthesia Procedure Notes (Signed)
Procedure Name: Intubation Date/Time: 12/06/2019 7:35 AM Performed by: Kyung Rudd, CRNA Pre-anesthesia Checklist: Patient identified, Emergency Drugs available, Suction available and Patient being monitored Patient Re-evaluated:Patient Re-evaluated prior to induction Oxygen Delivery Method: Circle system utilized Preoxygenation: Pre-oxygenation with 100% oxygen Induction Type: IV induction Ventilation: Mask ventilation without difficulty and Oral airway inserted - appropriate to patient size Laryngoscope Size: Mac and 4 Grade View: Grade III Tube type: Oral Tube size: 7.0 mm Number of attempts: 1 Airway Equipment and Method: Stylet Placement Confirmation: positive ETCO2 and breath sounds checked- equal and bilateral Secured at: 21 cm Tube secured with: Tape Dental Injury: Teeth and Oropharynx as per pre-operative assessment

## 2019-12-06 NOTE — Transfer of Care (Signed)
Immediate Anesthesia Transfer of Care Note  Patient: Robin Wilkins  Procedure(s) Performed: POSTERIOR LUMBAR INTERBODY FUSION LUMBAR FOUR-FIVE. (N/A Spine Lumbar)  Patient Location: PACU  Anesthesia Type:General  Level of Consciousness: drowsy  Airway & Oxygen Therapy: Patient Spontanous Breathing and Patient connected to nasal cannula oxygen  Post-op Assessment: Report given to RN, Post -op Vital signs reviewed and stable and Patient moving all extremities  Post vital signs: Reviewed and stable  Last Vitals:  Vitals Value Taken Time  BP 135/71 12/06/19 1052  Temp    Pulse 80 12/06/19 1053  Resp 23 12/06/19 1053  SpO2 86 % 12/06/19 1053  Vitals shown include unvalidated device data.  Last Pain:  Vitals:   12/06/19 [id number]  TempSrc: Oral  PainSc:          Complications: No apparent anesthesia complications

## 2019-12-06 NOTE — Op Note (Signed)
12/06/2019  10:47 AM  PATIENT:  Robin Wilkins  48 y.o. 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 Decompressive lumbar laminectomy, hemifacetectomy and foraminotomies L4-5 requiring more work than would be required for a simple exposure of the disk for PLIF in order to adequately decompress the neural elements and address the spinal stenosis 2. Posterior lumbar interbody fusion L4-5 using porous titanium interbody cages packed with morcellized allograft and autograft  3. Posterior fixation L4-5 using Alphatec cortical pedicle screws.  4. Intertransverse arthrodesis L4-5 using morcellized autograft and allograft.  SURGEON:  Sherley Bounds, MD  ASSISTANTS: Dr. Zada Finders  ANESTHESIA:  General  EBL: 100 ml  Total I/O In: 100 [IV Piggyback:100] Out: 100 [Blood:100]  BLOOD ADMINISTERED:none  DRAINS: none   INDICATION FOR PROCEDURE: This patient presented with severe back and right leg pain.  She had previous surgery at L4-5 on the right.  Imaging showed postlaminectomy dynamic instability L4-5.Marland Kitchen Imaging revealed right lateral recess stenosis also. The patient tried a reasonable attempt at conservative medical measures without relief. I recommended decompression and instrumented fusion to address the stenosis as well as the segmental  instability.  Patient understood the risks, benefits, and alternatives and potential outcomes and wished to proceed.  PROCEDURE DETAILS:  The patient was brought to the operating room. After induction of generalized endotracheal anesthesia the patient was rolled into the prone position on chest rolls and all pressure points were padded. The patient's lumbar region was cleaned and then prepped with DuraPrep and draped in the usual sterile fashion. Anesthesia was injected and then a dorsal midline incision was made and  carried down to the lumbosacral fascia. The fascia was opened and the paraspinous musculature was taken down in a subperiosteal fashion to expose L4-5. A self-retaining retractor was placed. Intraoperative fluoroscopy confirmed my level, and I started with placement of the L4 cortical pedicle screws. The pedicle screw entry zones were identified utilizing surface landmarks and  AP and lateral fluoroscopy. I scored the cortex with the high-speed drill and then used the hand drill to drill an upward and outward direction into the pedicle. I then tapped line to line. I then placed a 6.5 x 40 mm cortical pedicle screw into the pedicles of L4 bilaterally.   I then turned my attention to the decompression and complete lumbar laminectomies, hemi- facetectomies, and foraminotomies were performed at L4-5.  My nurse practitioner was directly involved in the decompression and exposure of the neural elements. the patient had significant spinal stenosis and this required more work than would be required for a simple exposure of the disc for posterior lumbar interbody fusion which would only require a limited laminotomy. Much more generous decompression and generous foraminotomy was undertaken in order to adequately decompress the neural elements and address the patient's leg pain. The yellow ligament was removed to expose the underlying dura and nerve roots, and generous foraminotomies were performed to adequately decompress the neural elements. Both the exiting and traversing nerve roots were decompressed on both sides until a coronary dilator passed easily along the nerve roots. Once the decompression was complete, I turned my attention to the posterior lower lumbar interbody fusion. The epidural venous vasculature was coagulated and cut sharply. Disc space was incised and the initial discectomy was performed with pituitary rongeurs. The disc space was distracted with sequential distractors to a height of 11 mm. We then used  a  series of scrapers and shavers to prepare the endplates for fusion. The midline was prepared with Epstein curettes. Once the complete discectomy was finished, we packed an appropriate sized interbody cage with local autograft and morcellized allograft, gently retracted the nerve root, and tapped the cage into position at L4-5.  The midline between the cages was packed with morselized autograft and allograft. We then turned our attention to the placement of the lower pedicle screws. The pedicle screw entry zones were identified utilizing surface landmarks and fluoroscopy. I drilled into each pedicle utilizing the hand drill, and tapped each pedicle with the appropriate tap. We palpated with a ball probe to assure no break in the cortex. We then placed 6.5 x 40 mm cortical pedicle screws into the pedicles bilaterally at L5.  My nurse practitioner and Dr. Zada Finders assisted in placement of the pedicle screws.  We then decorticated the transverse processes and laid a mixture of morcellized autograft and allograft out over these to perform intertransverse arthrodesis at L4-5. We then placed lordotic rods into the multiaxial screw heads of the pedicle screws and locked these in position with the locking caps and anti-torque device. We then checked our construct with AP and lateral fluoroscopy. Irrigated with copious amounts of bacitracin-containing saline solution. Inspected the nerve roots once again to assure adequate decompression, lined to the dura with Gelfoam, , and then we closed the muscle and the fascia with 0 Vicryl. Closed the subcutaneous tissues with 2-0 Vicryl and subcuticular tissues with 3-0 Vicryl. The skin was closed with benzoin and Steri-Strips. Dressing was then applied, the patient was awakened from general anesthesia and transported to the recovery room in stable condition. At the end of the procedure all sponge, needle and instrument counts were correct.   PLAN OF CARE: admit to  inpatient  PATIENT DISPOSITION:  PACU - hemodynamically stable.   Delay start of Pharmacological VTE agent (>24hrs) due to surgical blood loss or risk of bleeding:  yes

## 2019-12-07 DIAGNOSIS — M4316 Spondylolisthesis, lumbar region: Secondary | ICD-10-CM | POA: Diagnosis not present

## 2019-12-07 MED ORDER — OXYCODONE-ACETAMINOPHEN 5-325 MG PO TABS: 1.0000 | ORAL_TABLET | ORAL | 0 refills | Status: AC | PRN

## 2019-12-07 MED ORDER — METHOCARBAMOL 500 MG PO TABS: 500.0000 mg | ORAL_TABLET | Freq: Four times a day (QID) | ORAL | 1 refills | Status: AC | PRN

## 2019-12-07 MED ORDER — FLUCONAZOLE 100 MG PO TABS: 100.0000 mg | ORAL_TABLET | Freq: Every day | ORAL | 0 refills | Status: AC

## 2019-12-07 MED ORDER — FLUCONAZOLE 100 MG PO TABS
100.0000 mg | ORAL_TABLET | Freq: Every day | ORAL | Status: DC
  Filled 2019-12-07: qty 1

## 2019-12-07 NOTE — Evaluation (Signed)
Occupational Therapy Evaluation Patient Details Name: Robin Wilkins MRN: [id number] DOB: 09/25/1970 Today's Date: 12/07/2019    History of Present Illness Pt is a 49 y/o female s/p PLIF L4-L5. PMH including but not limited to L breast CA 2008 and HTN.   Clinical Impression   Patient is s/p see above surgery resulting in the deficits listed below (see OT Problem List). Pt at this time education on all precautions, assisted devices to increase in ability to follow with ADLS and use of brace. Pt at this time with assisted devices requires supervision to complete LE dressing/showering. Pt requires supervision with transfers due to unsteadiness on BLE. At this time does not need any further Occupational Therapy services.      Follow Up Recommendations  No OT follow up;Supervision - Intermittent    Equipment Recommendations  3 in 1 bedside commode;Tub/shower seat;Other (comment)(sock aide, long handle reacher and sponge)    Recommendations for Other Services       Precautions / Restrictions Precautions Precautions: Back Precaution Booklet Issued: Yes (comment) Precaution Comments: reviewed 3/3 back precautions with pt throughout Required Braces or Orthoses: Spinal Brace Spinal Brace: Lumbar corset;Applied in sitting position Restrictions Weight Bearing Restrictions: No      Mobility Bed Mobility Overal bed mobility: (presented EOB)             General bed mobility comments: pt seated EOB upon arrival  Transfers Overall transfer level: Needs assistance Equipment used: Rolling walker (2 wheeled) Transfers: Sit to/from Stand Sit to Stand: Supervision         General transfer comment: good technique utilized, supervision for safety    Balance Overall balance assessment: No apparent balance deficits (not formally assessed)                                         ADL either performed or assessed with clinical judgement   ADL Overall ADL's : Needs  assistance/impaired Eating/Feeding: Independent;Sitting   Grooming: Wash/dry hands;Wash/dry face;Modified independent;Standing   Upper Body Bathing: Modified independent;Sitting   Lower Body Bathing: Supervison/ safety;Cueing for safety;Cueing for sequencing;Sit to/from stand   Upper Body Dressing : Modified independent;Sitting   Lower Body Dressing: Supervision/safety;Sit to/from stand;Cueing for safety;Cueing for sequencing   Toilet Transfer: Supervision/safety;Regular Toilet;Grab bars   Toileting- Clothing Manipulation and Hygiene: Modified independent;Sit to/from stand   Tub/ Shower Transfer: Tub transfer;Minimal assistance;Shower seat   Functional mobility during ADLs: Min guard;Cueing for safety;Cueing for sequencing       Vision Baseline Vision/History: Wears glasses Wears Glasses: Reading only Patient Visual Report: No change from baseline Vision Assessment?: No apparent visual deficits     Perception Perception Perception Tested?: No   Praxis Praxis Praxis tested?: Not tested    Pertinent Vitals/Pain Pain Assessment: 0-10 Pain Score: 6  Pain Location: back Pain Descriptors / Indicators: Grimacing;Guarding Pain Intervention(s): Limited activity within patient's tolerance;Monitored during session;Repositioned     Hand Dominance     Extremity/Trunk Assessment Upper Extremity Assessment Upper Extremity Assessment: Overall WFL for tasks assessed   Lower Extremity Assessment Lower Extremity Assessment: Defer to PT evaluation   Cervical / Trunk Assessment Cervical / Trunk Assessment: Other exceptions Cervical / Trunk Exceptions: s/p lumbar sx   Communication Communication Communication: No difficulties   Cognition Arousal/Alertness: Awake/alert Behavior During Therapy: WFL for tasks assessed/performed Overall Cognitive Status: Within Functional Limits for tasks assessed  General Comments        Exercises     Shoulder Instructions      Home Living Family/patient expects to be discharged to:: Private residence Living Arrangements: Spouse/significant other;Children Available Help at Discharge: Family Type of Home: House Home Access: Stairs to enter Technical brewer of Steps: 5 Entrance Stairs-Rails: Left Home Layout: One level     Bathroom Shower/Tub: Teacher, early years/pre: Standard Bathroom Accessibility: Yes How Accessible: Accessible via walker Home Equipment: None          Prior Functioning/Environment Level of Independence: Independent                 OT Problem List: Decreased strength;Decreased activity tolerance;Impaired balance (sitting and/or standing);Decreased safety awareness;Decreased knowledge of use of DME or AE;Decreased knowledge of precautions;Pain      OT Treatment/Interventions:      OT Goals(Current goals can be found in the care plan section) Acute Rehab OT Goals Patient Stated Goal: decrease pain OT Goal Formulation: With patient Time For Goal Achievement: 12/28/19 Potential to Achieve Goals: Good  OT Frequency:     Barriers to D/C:            Co-evaluation              AM-PAC OT "6 Clicks" Daily Activity     Outcome Measure Help from another person eating meals?: None Help from another person taking care of personal grooming?: None Help from another person toileting, which includes using toliet, bedpan, or urinal?: None Help from another person bathing (including washing, rinsing, drying)?: A Little Help from another person to put on and taking off regular upper body clothing?: None Help from another person to put on and taking off regular lower body clothing?: A Little 6 Click Score: 22   End of Session Equipment Utilized During Treatment: Gait belt;Back brace Nurse Communication: Mobility status  Activity Tolerance: Patient tolerated treatment well Patient left: in bed;with call bell/phone  within reach  OT Visit Diagnosis: Unsteadiness on feet (R26.81);Muscle weakness (generalized) (M62.81);Pain Pain - part of body: (back)                Time: [id number] OT Time Calculation (min): 32 min Charges:  OT General Charges $OT Visit: 1 Visit OT Evaluation $OT Eval Low Complexity: 1 Low OT Treatments $Self Care/Home Management : 8-22 mins  Joeseph Amor OTR/L  Acute Rehab Services  [phone] office number [phone] pager number   Joeseph Amor 12/07/2019, 8:57 AM

## 2019-12-07 NOTE — Anesthesia Postprocedure Evaluation (Signed)
Anesthesia Post Note  Patient: Robin Wilkins  Procedure(s) Performed: POSTERIOR LUMBAR INTERBODY FUSION LUMBAR FOUR-FIVE. (N/A Spine Lumbar)     Patient location during evaluation: PACU Anesthesia Type: General Level of consciousness: awake and alert Pain management: pain level controlled Vital Signs Assessment: post-procedure vital signs reviewed and stable Respiratory status: spontaneous breathing, nonlabored ventilation, respiratory function stable and patient connected to nasal cannula oxygen Cardiovascular status: blood pressure returned to baseline and stable Postop Assessment: no apparent nausea or vomiting Anesthetic complications: no    Last Vitals:  Vitals:   12/07/19 0344 12/07/19 0820  BP: (!) 105/51 131/73  Pulse: 64 73  Resp: 14 18  Temp: 37 C 37.1 C  SpO2: 97% 99%    Last Pain:  Vitals:   12/07/19 1034  TempSrc:   PainSc: 6                  Vermon Grays

## 2019-12-07 NOTE — Discharge Instructions (Signed)

## 2019-12-07 NOTE — Progress Notes (Signed)
Patient is discharged from room 3C08 at this time. Alert and in stable condition. IV site d/c'd and instructions read to patient and spouse with understanding verbalized and all questions answered. Left unit via wheelchair with all belongings at side.

## 2019-12-07 NOTE — Evaluation (Signed)
Physical Therapy Evaluation Patient Details Name: Robin Wilkins MRN: [id number] DOB: June 27, 1971 Today's Date: 12/07/2019   History of Present Illness  Pt is a 49 y/o female s/p PLIF L4-L5. PMH including but not limited to L breast CA 2008 and HTN.  Clinical Impression  Pt presented sitting upright at EOB, awake and willing to participate in therapy session. Prior to admission, pt reported that she was independent with all functional mobility and ADLs. Pt lives with her husband in a single level home with five steps to enter. At the time of evaluation, pt overall moving very well. She ambulated in hallway with use of RW without LOB or need for physical assistance. Additionally, she participated in stair training this session without difficulties. PT provided pt education re: back precautions as they relate to functional mobility, car transfers with demonstration and a generalized walking program for pt to initiate upon d/c home. No further acute PT needs identified at this time. PT signing off.     Follow Up Recommendations No PT follow up    Equipment Recommendations  Rolling walker with 5" wheels;3in1 (PT);Other (comment)(shower seat)    Recommendations for Other Services       Precautions / Restrictions Precautions Precautions: Back Precaution Comments: reviewed 3/3 back precautions with pt throughout Required Braces or Orthoses: Spinal Brace Spinal Brace: Lumbar corset;Applied in sitting position Restrictions Weight Bearing Restrictions: No      Mobility  Bed Mobility               General bed mobility comments: pt seated EOB upon arrival  Transfers Overall transfer level: Needs assistance Equipment used: Rolling walker (2 wheeled) Transfers: Sit to/from Stand Sit to Stand: Supervision         General transfer comment: good technique utilized, supervision for safety  Ambulation/Gait Ambulation/Gait assistance: Min Gaffer (Feet): 500  Feet Assistive device: Rolling walker (2 wheeled) Gait Pattern/deviations: Step-through pattern Gait velocity: decreased   General Gait Details: pt overall steady with use of RW; she demonstrated good upright posture; no LOB or need for physical assistance  Stairs Stairs: Yes Stairs assistance: Min guard Stair Management: One rail Left;Step to pattern;Forwards Number of Stairs: 5 General stair comments: guarding for safety; no LOB or instability  Wheelchair Mobility    Modified Rankin (Stroke Patients Only)       Balance Overall balance assessment: No apparent balance deficits (not formally assessed)                                           Pertinent Vitals/Pain Pain Assessment: 0-10 Pain Score: 6  Pain Location: back Pain Descriptors / Indicators: Grimacing;Guarding Pain Intervention(s): Monitored during session;Repositioned    Home Living Family/patient expects to be discharged to:: Private residence Living Arrangements: Spouse/significant other;Children Available Help at Discharge: Family Type of Home: House Home Access: Stairs to enter Entrance Stairs-Rails: Left Entrance Stairs-Number of Steps: 5 Home Layout: One level Home Equipment: None      Prior Function Level of Independence: Independent               Hand Dominance        Extremity/Trunk Assessment   Upper Extremity Assessment Upper Extremity Assessment: Defer to OT evaluation;Overall [facility] for tasks assessed    Lower Extremity Assessment Lower Extremity Assessment: Overall WFL for tasks assessed    Cervical / Trunk Assessment Cervical / Trunk  Assessment: Other exceptions Cervical / Trunk Exceptions: s/p lumbar sx  Communication   Communication: No difficulties  Cognition Arousal/Alertness: Awake/alert Behavior During Therapy: WFL for tasks assessed/performed Overall Cognitive Status: Within Functional Limits for tasks assessed                                         General Comments      Exercises     Assessment/Plan    PT Assessment Patent does not need any further PT services  PT Problem List         PT Treatment Interventions      PT Goals (Current goals can be found in the Care Plan section)  Acute Rehab PT Goals Patient Stated Goal: decrease pain PT Goal Formulation: All assessment and education complete, DC therapy    Frequency     Barriers to discharge        Co-evaluation               AM-PAC PT "6 Clicks" Mobility  Outcome Measure Help needed turning from your back to your side while in a flat bed without using bedrails?: None Help needed moving from lying on your back to sitting on the side of a flat bed without using bedrails?: None Help needed moving to and from a bed to a chair (including a wheelchair)?: None Help needed standing up from a chair using your arms (e.g., wheelchair or bedside chair)?: None Help needed to walk in hospital room?: None Help needed climbing 3-5 steps with a railing? : None 6 Click Score: 24    End of Session Equipment Utilized During Treatment: Back brace Activity Tolerance: Patient tolerated treatment well Patient left: in bed;with call bell/phone within reach Nurse Communication: Mobility status PT Visit Diagnosis: Other abnormalities of gait and mobility (R26.89)    Time: [id number] PT Time Calculation (min) (ACUTE ONLY): 26 min   Charges:   PT Evaluation $PT Eval Low Complexity: 1 Low PT Treatments $Gait Training: 8-22 mins        Anastasio Champion, DPT  Acute Rehabilitation Services Pager [phone] Office [place] 12/07/2019, 8:19 AM

## 2019-12-07 NOTE — Discharge Summary (Addendum)
Physician Discharge Summary  Patient ID: Robin Wilkins MRN: [id number] DOB/AGE: 50-Oct-1972 50 y.o.  Admit date: 12/06/2019 Discharge date: 12/07/2019  Admission Diagnoses: Lumbago, lumbar radiculopathy  Discharge Diagnoses: The same   Active Problems:   S/P lumbar fusion   Discharged Condition: good  Hospital Course: Dr. Ronnald Ramp performed an L4-5 decompression, instrumentation and fusion on the patient on 12/06/2019.  The patient's postoperative course was unremarkable.  On postoperative day #1 she requested discharge to home.  She was given written and oral discharge instructions.  All her questions were answered.  The patient requested that a Diflucan prescription be sent in.  Consults: PT, OT, care management Significant Diagnostic Studies: None Treatments: L4-5 decompression, instrumentation and fusion. Discharge Exam: Blood pressure (!) 105/51, pulse 64, temperature 98.6 F (37 C), temperature source Oral, resp. rate 14, height 5\' 8"  (1.727 m), weight 116.1 kg, SpO2 97 %. The patient is alert and pleasant.  She looks well.  Her strength is normal.  Disposition: Home  Discharge Instructions    Call MD for:  difficulty breathing, headache or visual disturbances   Complete by: As directed    Call MD for:  extreme fatigue   Complete by: As directed    Call MD for:  hives   Complete by: As directed    Call MD for:  persistant dizziness or light-headedness   Complete by: As directed    Call MD for:  persistant nausea and vomiting   Complete by: As directed    Call MD for:  redness, tenderness, or signs of infection (pain, swelling, redness, odor or green/yellow discharge around incision site)   Complete by: As directed    Call MD for:  severe uncontrolled pain   Complete by: As directed    Call MD for:  temperature >100.4   Complete by: As directed    Diet - low sodium heart healthy   Complete by: As directed    Discharge instructions   Complete by: As directed    Call  [phone] for a followup appointment. Take a stool softener while you are using pain medications.   Driving Restrictions   Complete by: As directed    Do not drive for 2 weeks.   Increase activity slowly   Complete by: As directed    Lifting restrictions   Complete by: As directed    Do not lift more than 5 pounds. No excessive bending or twisting.   May shower / Bathe   Complete by: As directed    Remove the dressing for 3 days after surgery.  You may shower, but leave the incision alone.   Remove dressing in 48 hours   Complete by: As directed    Your stitches are under the scan and will dissolve by themselves. The Steri-Strips will fall off after you take a few showers. Do not rub back or pick at the wound, Leave the wound alone.     Allergies as of 12/07/2019   No Known Allergies     Medication List    STOP taking these medications   naproxen 500 MG EC tablet Commonly known as: EC NAPROSYN     TAKE these medications   bisoprolol-hydrochlorothiazide 10-6.25 MG tablet Commonly known as: ZIAC Take 1 tablet by mouth daily.   calcium carbonate 500 MG chewable tablet Commonly known as: TUMS - dosed in mg elemental calcium Chew 1 tablet by mouth daily as needed for indigestion or heartburn.   Vitamin D 125 MCG (5000 UT) Caps  Take 5,000 Units by mouth daily.   [employer] D3 25 MCG (1000 UT) capsule Generic drug: Cholecalciferol TAKE 1 CAPSULE BY MOUTH EVERY DAY   Melatonin 2.5 MG Caps Take 1 capsule (2.5 mg total) by mouth at bedtime as needed (insomnia).   methocarbamol 500 MG tablet Commonly known as: ROBAXIN Take 1 tablet (500 mg total) by mouth every 6 (six) hours as needed for muscle spasms.   ondansetron 4 MG disintegrating tablet Commonly known as: Zofran ODT Allow 1-2 tablets to dissolve in your mouth every 8 hours as needed for nausea/vomiting   oxyCODONE-acetaminophen 5-325 MG tablet Commonly known as: PERCOCET/ROXICET Take 1-2 tablets by mouth every 4 (four)  hours as needed for moderate pain. What changed:   how much to take  when to take this  reasons to take this   tamoxifen 10 MG tablet Commonly known as: NOLVADEX Take 2 tablets (20 mg total) by mouth daily.            Durable Medical Equipment  (From admission, onward)         Start     Ordered   12/06/19 1312  DME Walker rolling  Once    Question:  Patient needs a walker to treat with the following condition  Answer:  S/P lumbar fusion   12/06/19 1311   12/06/19 1312  DME 3 n 1  Once     12/06/19 1311           Signed: Ophelia Charter 12/07/2019, 8:03 AM

## 2019-12-07 NOTE — Progress Notes (Signed)
Received phone call from nurse that patient was discharging today. DME orders placed yesterday for 3N1 and RW. Nurse stated that equipment could be provided from their DME closet. Additional equipment needs for shower chair - advised that this could not be delivered to the room, but patient could purchase at a retail medical supply store or drug store like [facility] or [facility] that has DME. NCM could not speak to whether workman's comp would reimburse patient. Advised to save all receipts.   Nurse asked about [place] OT - advised that OT could not stand alone for [facility], and advised that patient should be referred to outpatient therapy.   Manya Silvas, RN MSN CCM Transitions of Care [phone]

## 2020-11-22 IMAGING — CR DG CHEST 2V
2 series · 2 of 2 positions shown · non-contrast
Comparison: Chest radiographs 05/11/2018 and earlier.

CLINICAL DATA: 48-year-old female preoperative study for lumbar
surgery.

EXAM:
CHEST - 2 VIEW

[w chest pa]
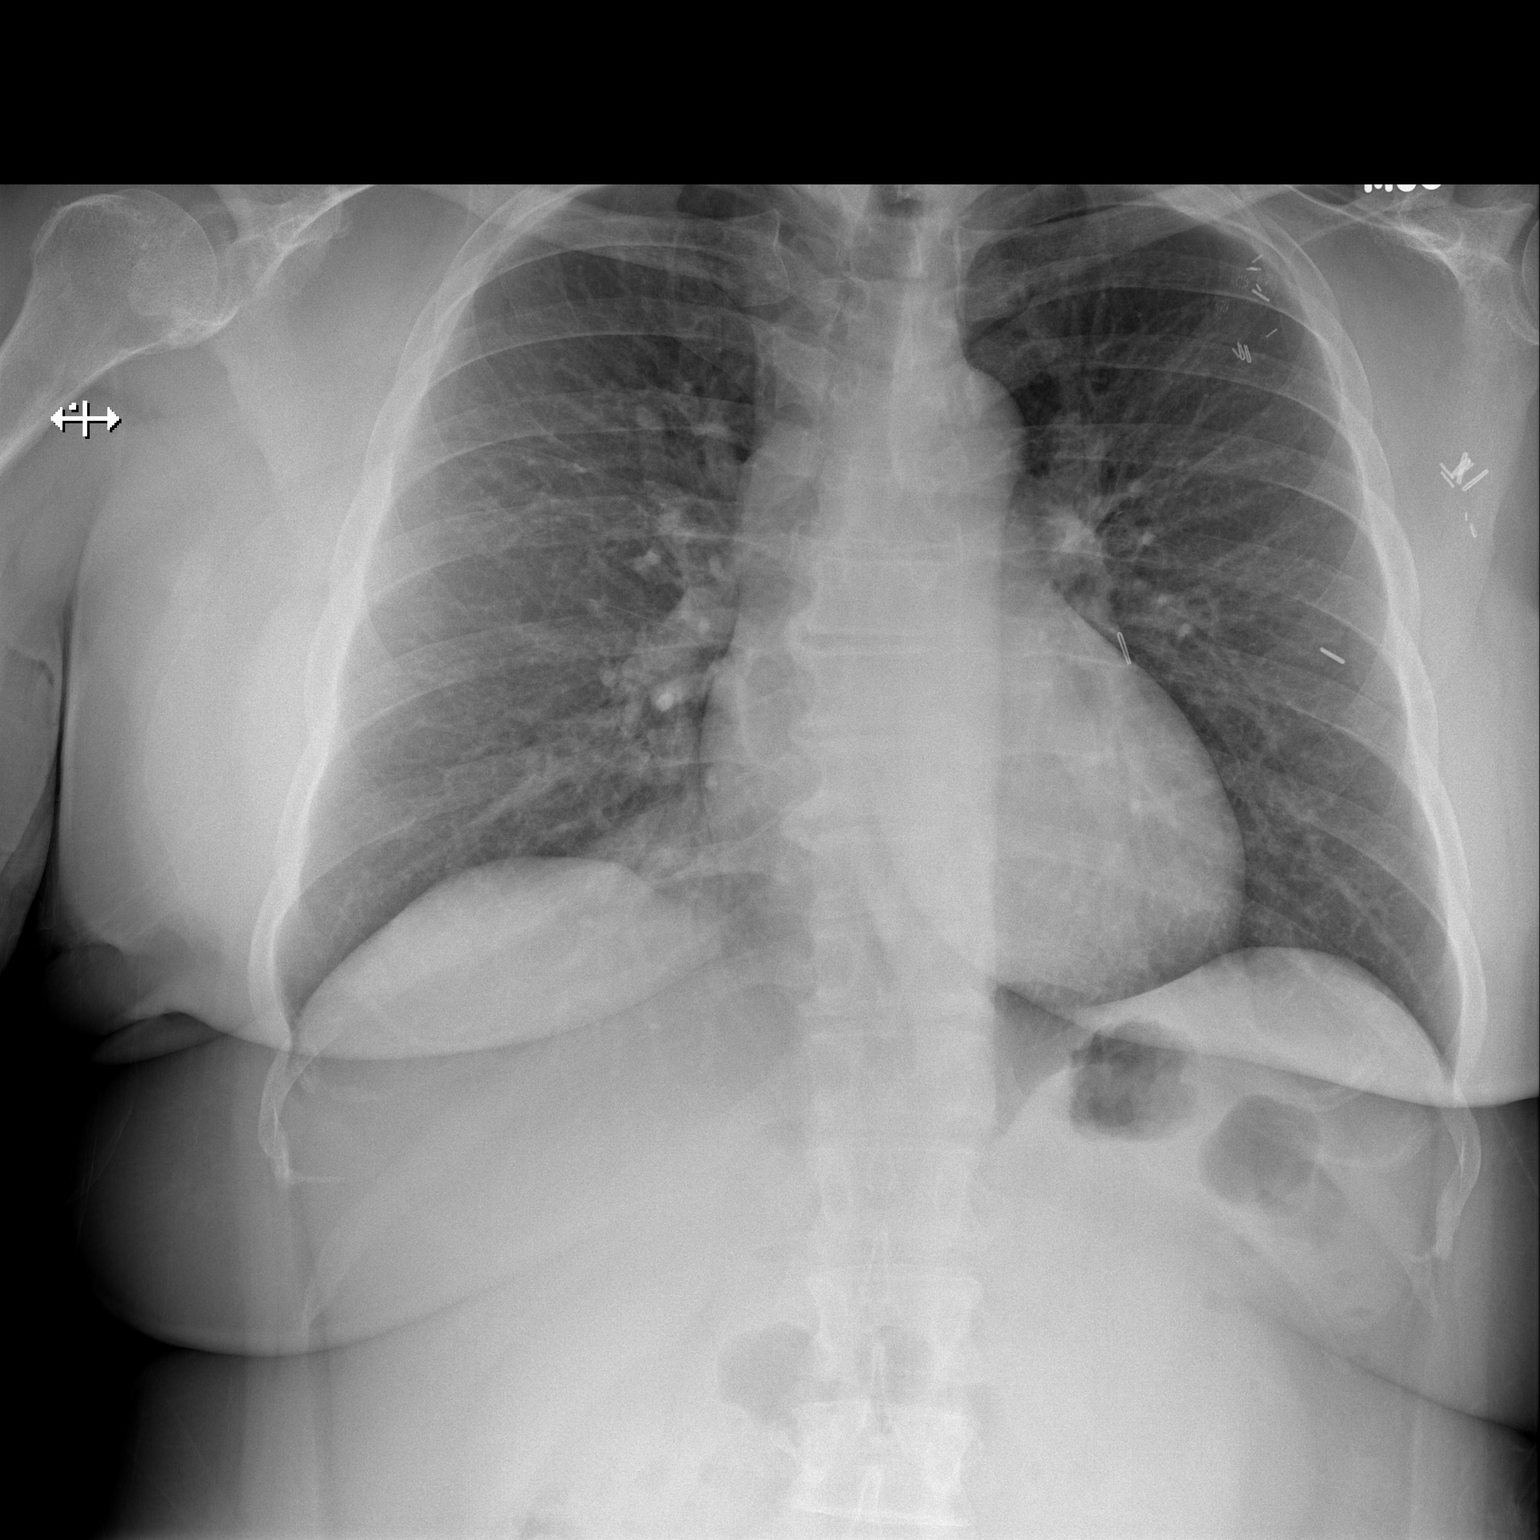

[w chest lat]
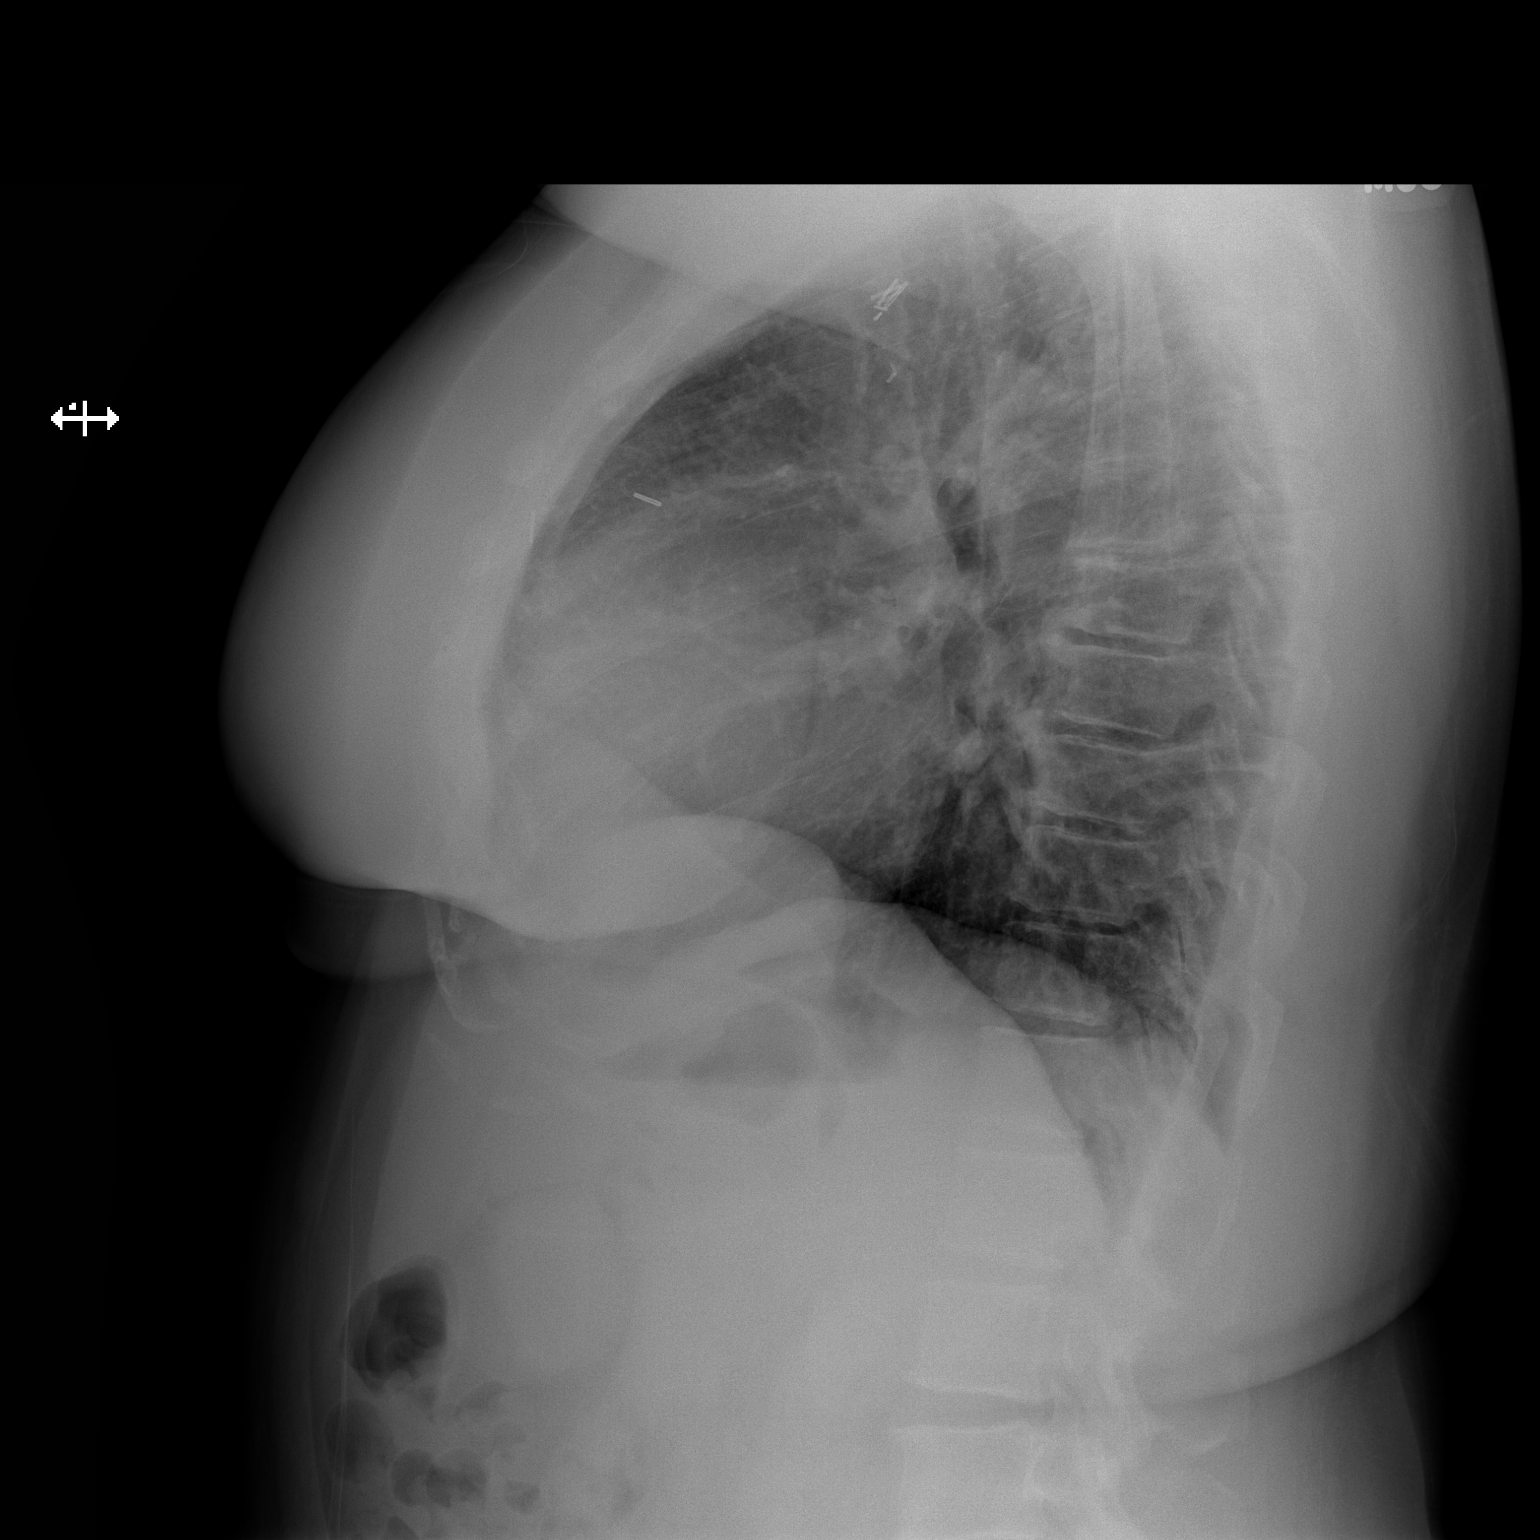

[2 of 2 positions shown; findings below may reference images not displayed]

FINDINGS: Lung volumes and mediastinal contours remain normal. Visualized
tracheal air column is within normal limits. Both lungs appear
stable and clear. No pneumothorax or pleural effusion.

Extensive left chest wall/axillary surgical clips are stable. No
acute osseous abnormality identified. Negative visible bowel gas
pattern.
IMPRESSION: No acute cardiopulmonary abnormality.

## 2020-11-25 IMAGING — RF DG LUMBAR SPINE 2-3V
1 series · 2 of 2 positions shown · non-contrast
Comparison: None.

CLINICAL DATA: L4-5 PLIF

EXAM:
LUMBAR SPINE - 2-3 VIEW; DG C-ARM 1-60 MIN

[Series 1: run · 2 of 2 slices shown]
[im 1/2]
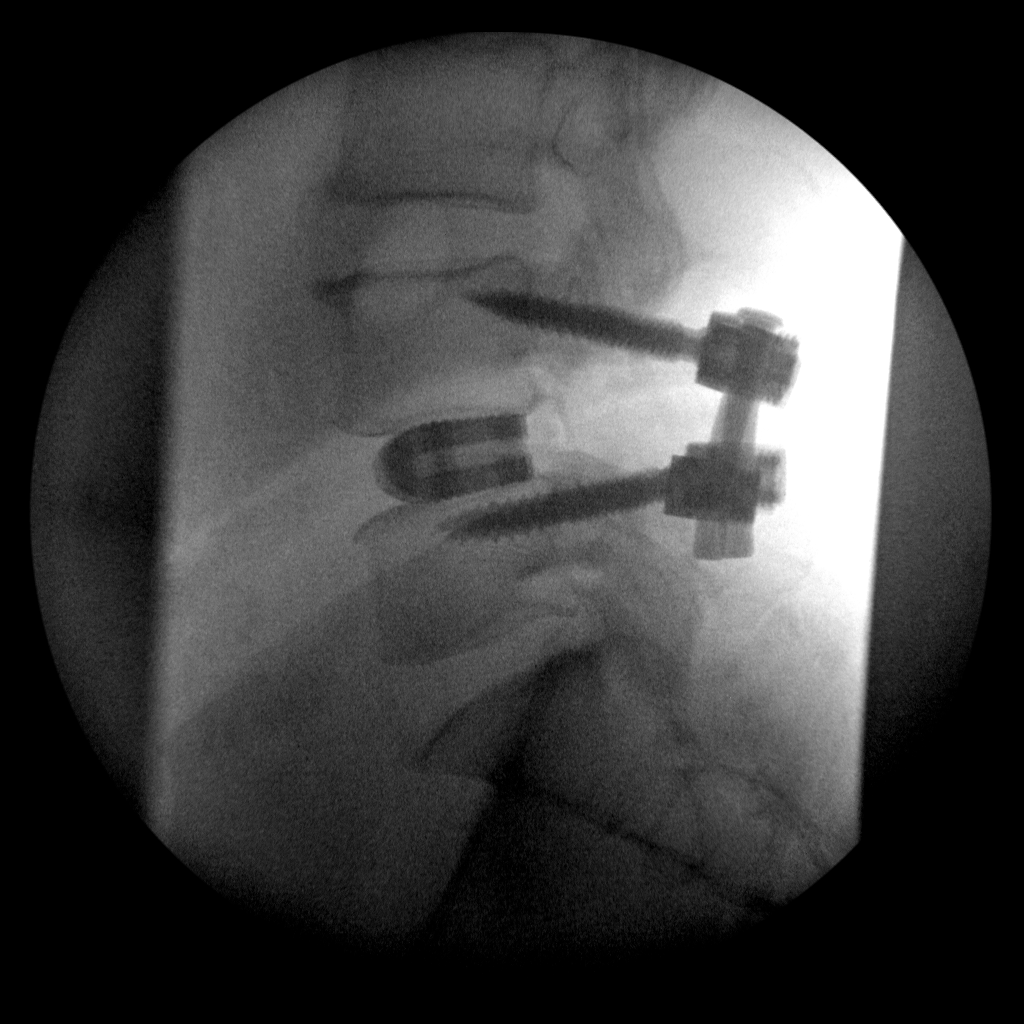
[im 2/2]
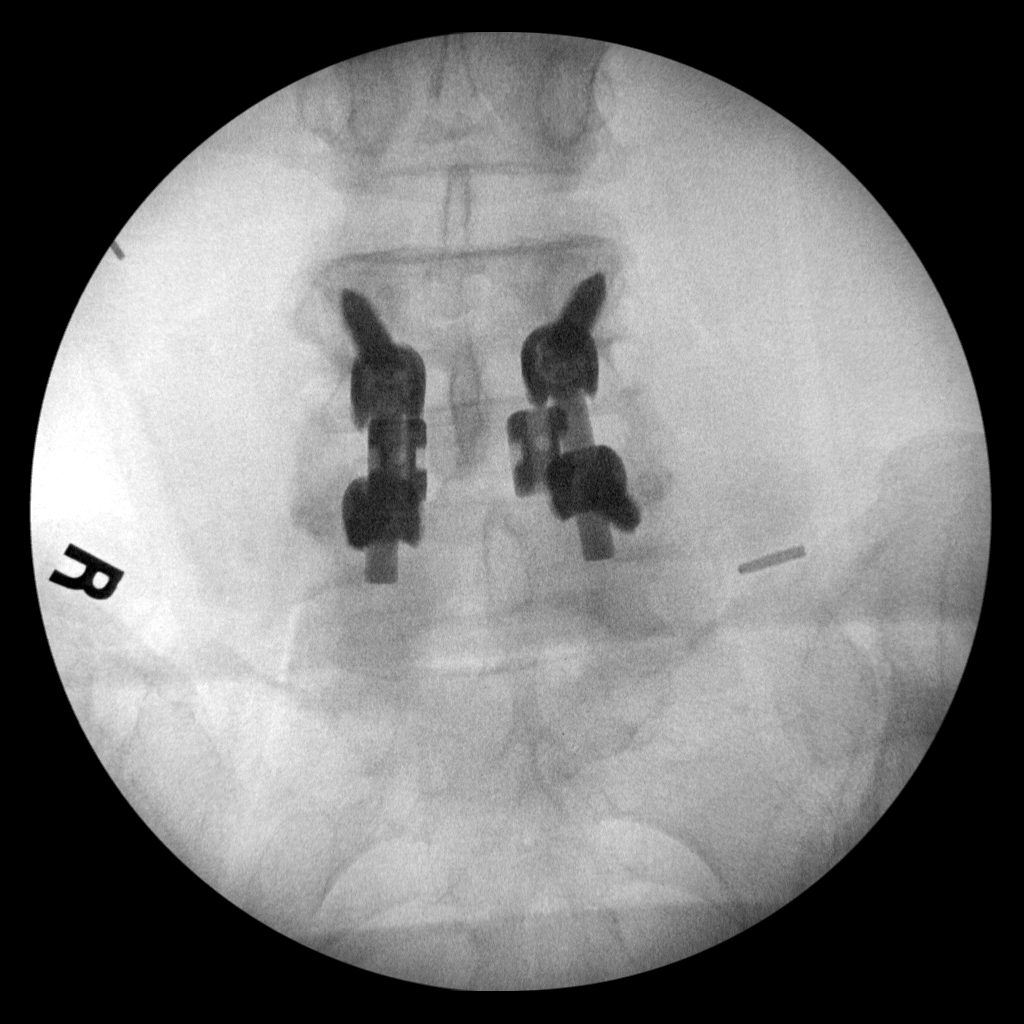

[2 of 2 positions shown; findings below may reference images not displayed]

FINDINGS: A p.m. lateral fluoroscopic spot images are provided of the lower
lumbar spine. Fixation hardware appears appropriately positioned at
the L4-L5 levels with intervening disc graft/spacer. Fluoroscopy
provided for 1 minutes 2 seconds.
IMPRESSION: Intraoperative fluoroscopic spot images demonstrating fixation
hardware at the L4-L5 levels. Hardware appears appropriately
positioned. No evidence of surgical complicating feature.

## 2020-11-25 IMAGING — RF DG C-ARM 1-60 MIN
1 series · 2 of 2 positions shown · non-contrast
Comparison: None.

CLINICAL DATA: L4-5 PLIF

EXAM:
LUMBAR SPINE - 2-3 VIEW; DG C-ARM 1-60 MIN

[Series 1: run · 2 of 2 slices shown]
[im 1/2]
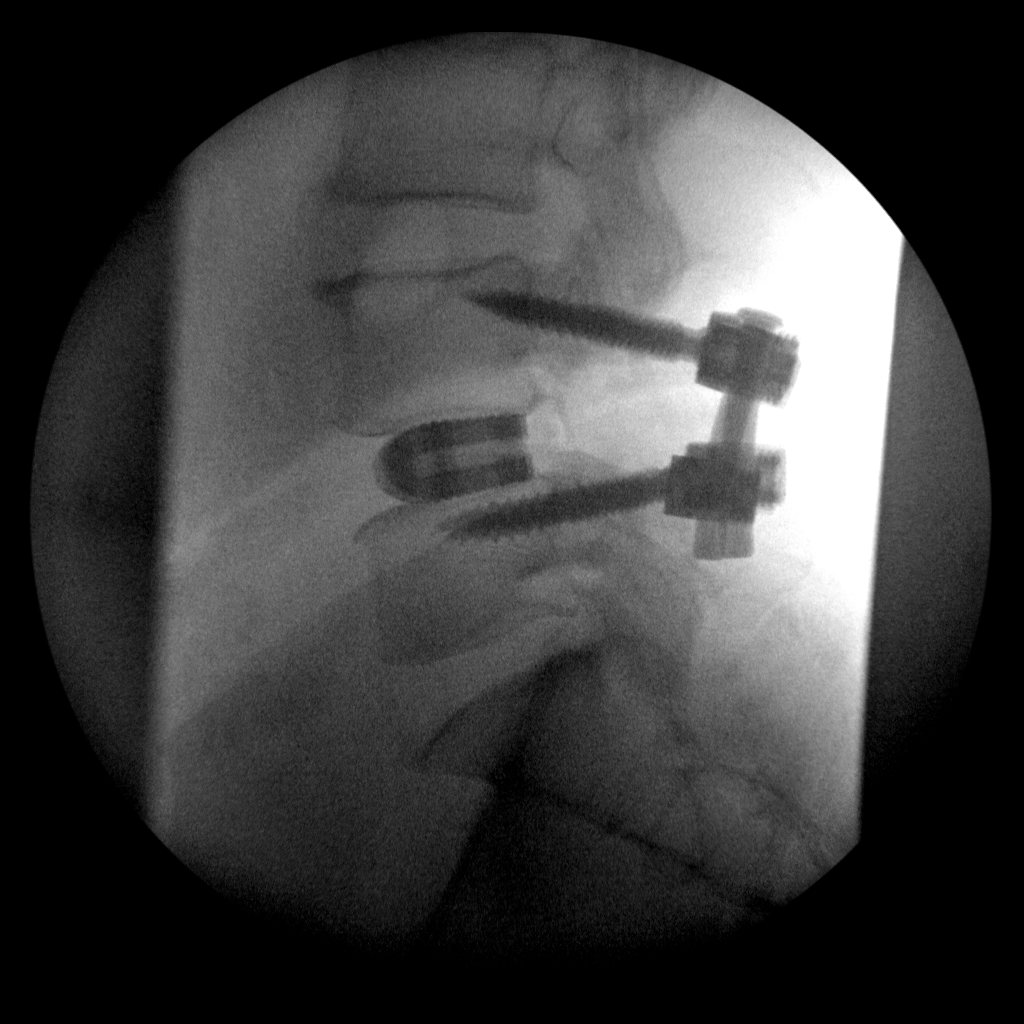
[im 2/2]
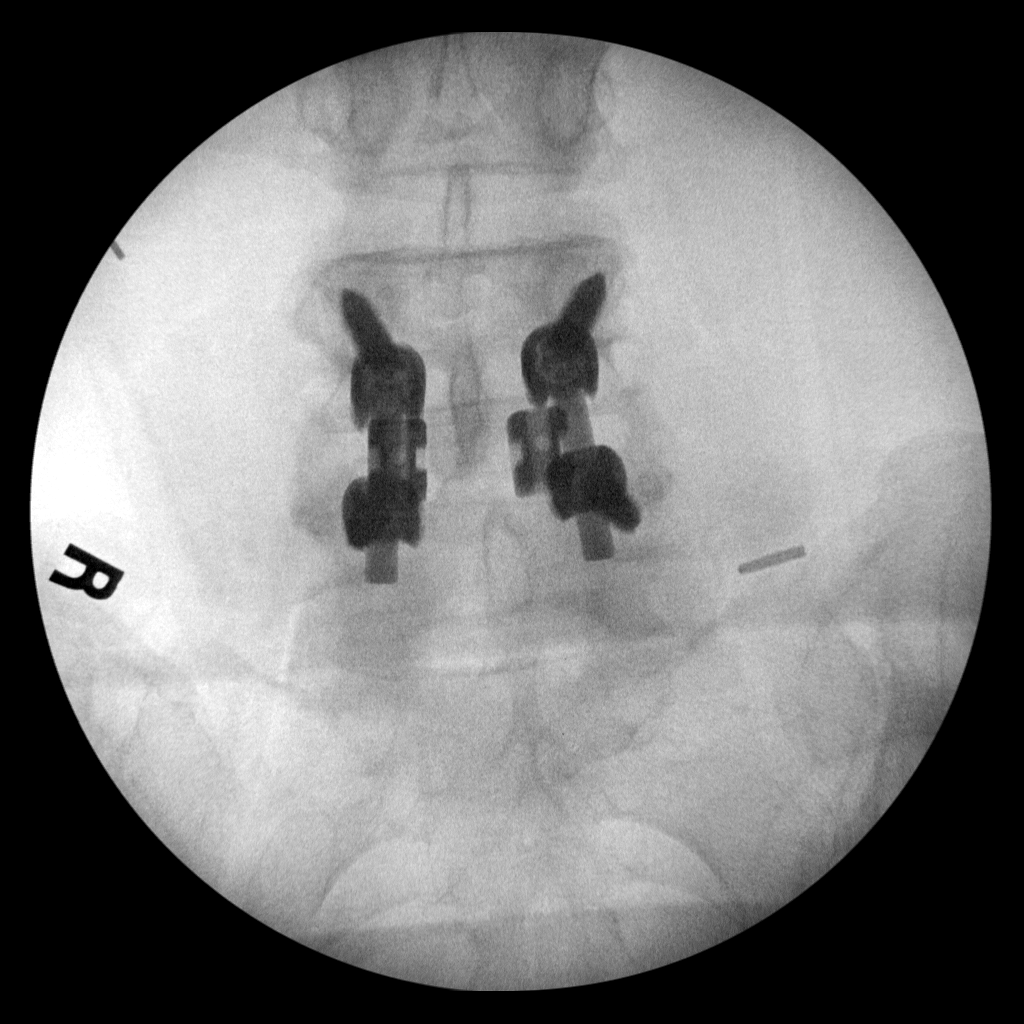

[2 of 2 positions shown; findings below may reference images not displayed]

FINDINGS: A p.m. lateral fluoroscopic spot images are provided of the lower
lumbar spine. Fixation hardware appears appropriately positioned at
the L4-L5 levels with intervening disc graft/spacer. Fluoroscopy
provided for 1 minutes 2 seconds.
IMPRESSION: Intraoperative fluoroscopic spot images demonstrating fixation
hardware at the L4-L5 levels. Hardware appears appropriately
positioned. No evidence of surgical complicating feature.

## 2022-04-05 ENCOUNTER — Other Ambulatory Visit: Payer: Self-pay

## 2022-04-05 ENCOUNTER — Ambulatory Visit
Admission: RE | Admit: 2022-04-05 | Discharge: 2022-04-05 | Disposition: A | Discharge status: Home / Self Care | Payer: Self-pay | Source: Ambulatory Visit | Attending: Neurosurgery | Admitting: Neurosurgery

## 2022-04-05 DIAGNOSIS — Z049 Encounter for examination and observation for unspecified reason: Secondary | ICD-10-CM

## 2023-06-09 ENCOUNTER — Other Ambulatory Visit: Payer: Self-pay | Admitting: Nurse Practitioner

## 2023-06-09 DIAGNOSIS — R1310 Dysphagia, unspecified: Secondary | ICD-10-CM

## 2023-06-14 ENCOUNTER — Ambulatory Visit
Admission: RE | Admit: 2023-06-14 | Discharge: 2023-06-14 | Disposition: A | Discharge status: Home / Self Care | Payer: 59 | Source: Ambulatory Visit | Attending: Nurse Practitioner | Admitting: Nurse Practitioner

## 2023-06-14 DIAGNOSIS — R1310 Dysphagia, unspecified: Secondary | ICD-10-CM | POA: Diagnosis present

## 2023-08-07 ENCOUNTER — Ambulatory Visit: Payer: 59

## 2023-08-07 DIAGNOSIS — R1310 Dysphagia, unspecified: Secondary | ICD-10-CM

## 2023-08-07 DIAGNOSIS — R933 Abnormal findings on diagnostic imaging of other parts of digestive tract: Secondary | ICD-10-CM

## 2023-12-19 ENCOUNTER — Ambulatory Visit (LOCAL_COMMUNITY_HEALTH_CENTER): Payer: Self-pay

## 2023-12-19 DIAGNOSIS — Z23 Encounter for immunization: Secondary | ICD-10-CM | POA: Diagnosis not present

## 2023-12-19 DIAGNOSIS — Z719 Counseling, unspecified: Secondary | ICD-10-CM

## 2023-12-19 NOTE — Progress Notes (Signed)
 In nurse clinic for immunizations, requesting MMR vaccine d/t low titer. Patient reports hx of breast cancer 17 yrs ago. Patient also reports having steroid shots x2 in back in March 2025 and steroid shots 2x in back 3 months before then. Consulted with C Lynn, MD who gives okay for patient to receive vaccines. Voices no concerns. VIS reviewed and given to patient. Vaccine tolerated well; no issues noted. [facility] updated and copy given to patient.   Clare Critchley, RN

## 2024-07-26 ENCOUNTER — Ambulatory Visit
Admission: RE | Admit: 2024-07-26 | Discharge: 2024-07-26 | Disposition: A | Discharge status: Home / Self Care | Source: Ambulatory Visit | Attending: Family Medicine | Admitting: Family Medicine

## 2024-07-26 ENCOUNTER — Other Ambulatory Visit: Payer: Self-pay | Admitting: Family Medicine

## 2024-07-26 DIAGNOSIS — M79604 Pain in right leg: Secondary | ICD-10-CM
